# Patient Record
Sex: Male | Born: 1985 | Race: Black or African American | Hispanic: No | Marital: Single | Smoking: Never smoker
Health system: Southern US, Community
[De-identification: ages and names within clinical notes are randomized; demographics above are authoritative.]

## PROBLEM LIST (undated history)

## (undated) DIAGNOSIS — Z21 Asymptomatic human immunodeficiency virus [HIV] infection status: Secondary | ICD-10-CM

## (undated) DIAGNOSIS — B20 Human immunodeficiency virus [HIV] disease: Secondary | ICD-10-CM

## (undated) DIAGNOSIS — J45909 Unspecified asthma, uncomplicated: Secondary | ICD-10-CM

---
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
## 2011-09-27 DIAGNOSIS — B20 Human immunodeficiency virus [HIV] disease: Secondary | ICD-10-CM | POA: Insufficient documentation

## 2013-04-04 DIAGNOSIS — J329 Chronic sinusitis, unspecified: Secondary | ICD-10-CM | POA: Insufficient documentation

## 2013-05-16 DIAGNOSIS — R209 Unspecified disturbances of skin sensation: Secondary | ICD-10-CM | POA: Insufficient documentation

## 2013-09-25 DIAGNOSIS — B353 Tinea pedis: Secondary | ICD-10-CM | POA: Insufficient documentation

## 2013-09-25 DIAGNOSIS — J309 Allergic rhinitis, unspecified: Secondary | ICD-10-CM | POA: Insufficient documentation

## 2013-09-25 DIAGNOSIS — F410 Panic disorder [episodic paroxysmal anxiety] without agoraphobia: Secondary | ICD-10-CM | POA: Insufficient documentation

## 2013-09-25 DIAGNOSIS — G4700 Insomnia, unspecified: Secondary | ICD-10-CM | POA: Insufficient documentation

## 2013-09-25 DIAGNOSIS — L509 Urticaria, unspecified: Secondary | ICD-10-CM | POA: Insufficient documentation

## 2013-09-25 DIAGNOSIS — R634 Abnormal weight loss: Secondary | ICD-10-CM | POA: Insufficient documentation

## 2013-09-25 DIAGNOSIS — J301 Allergic rhinitis due to pollen: Secondary | ICD-10-CM | POA: Insufficient documentation

## 2013-12-26 DIAGNOSIS — F32A Depression, unspecified: Secondary | ICD-10-CM | POA: Insufficient documentation

## 2013-12-26 DIAGNOSIS — F329 Major depressive disorder, single episode, unspecified: Secondary | ICD-10-CM | POA: Insufficient documentation

## 2016-05-12 DIAGNOSIS — J4541 Moderate persistent asthma with (acute) exacerbation: Secondary | ICD-10-CM | POA: Insufficient documentation

## 2016-10-20 DIAGNOSIS — K900 Celiac disease: Secondary | ICD-10-CM | POA: Insufficient documentation

## 2018-01-12 ENCOUNTER — Emergency Department (HOSPITAL_COMMUNITY)
Admission: EM | Admit: 2018-01-12 | Discharge: 2018-01-12 | Disposition: A | Discharge status: Home / Self Care | Payer: BLUE CROSS/BLUE SHIELD | Attending: Emergency Medicine | Admitting: Emergency Medicine

## 2018-01-12 ENCOUNTER — Encounter (HOSPITAL_COMMUNITY): Payer: Self-pay | Admitting: Emergency Medicine

## 2018-01-12 DIAGNOSIS — T7840XA Allergy, unspecified, initial encounter: Secondary | ICD-10-CM | POA: Insufficient documentation

## 2018-01-12 DIAGNOSIS — T783XXA Angioneurotic edema, initial encounter: Secondary | ICD-10-CM | POA: Diagnosis not present

## 2018-01-12 DIAGNOSIS — R220 Localized swelling, mass and lump, head: Secondary | ICD-10-CM | POA: Diagnosis present

## 2018-01-12 MED ORDER — DIPHENHYDRAMINE HCL 25 MG PO TABS: ORAL_TABLET | ORAL | 0 refills | Status: AC

## 2018-01-12 MED ORDER — FAMOTIDINE 20 MG PO TABS: ORAL_TABLET | ORAL | 0 refills | Status: DC

## 2018-01-12 MED ORDER — PREDNISONE 20 MG PO TABS: ORAL_TABLET | ORAL | 0 refills | Status: DC

## 2018-01-12 MED ORDER — FAMOTIDINE 20 MG PO TABS
20.0000 mg | ORAL_TABLET | Freq: Once | ORAL | Status: AC
  Administered 2018-01-12: 20 mg via ORAL
  Filled 2018-01-12: qty 1

## 2018-01-12 MED ORDER — DIPHENHYDRAMINE HCL 25 MG PO CAPS
25.0000 mg | ORAL_CAPSULE | Freq: Once | ORAL | Status: AC
  Administered 2018-01-12: 25 mg via ORAL
  Filled 2018-01-12: qty 1

## 2018-01-12 MED ORDER — PREDNISONE 20 MG PO TABS
60.0000 mg | ORAL_TABLET | Freq: Once | ORAL | Status: AC
  Administered 2018-01-12: 60 mg via ORAL
  Filled 2018-01-12: qty 3

## 2018-01-12 NOTE — Discharge Instructions (Signed)
1.  At this time, your reaction does not appear to severe.  Discussed safety of ongoing annual use of influenza immunization with your infectious disease doctor. 2.  Take prednisone for the next 3 days.  Take Pepcid twice daily for 3 to 4 days.  Take Benadryl 25 to 50 mg every 6 hours as needed for itching and swelling.

## 2018-01-12 NOTE — ED Triage Notes (Signed)
Pt reports last night got flu shot. This morning when woke up had hives and facial swelling. Took Benadryl 25mg  at 730am.

## 2018-01-12 NOTE — ED Provider Notes (Signed)
[facility]-EMERGENCY DEPT Provider Note   CSN: [id number] Arrival date & time: 01/12/18  4098     History   Chief Complaint Chief Complaint  Patient presents with  . Angioedema    HPI Gordon Turner is a 32 y.o. male.  HPI Patient got an influenza shot yesterday.  He was feeling well.  This morning he awakened and noted that his lips were swelling.  He also felt itchy on his legs and arms.  He felt that he was having allergic reaction.  He took 25 mg of Benadryl.  He reports his symptoms have improved since onset.  He denies any difficulty swallowing or breathing.  Reports he did have an allergic reaction about 4 years ago to penicillin that was about "4 times as bad".  Patient does not take an ACE inhibitor.  His only regular scheduled medications are for asthma and his HIV medication.  He reports his HIV is very well controlled and he is compliant with medications.   History reviewed. No pertinent past medical history.  There are no active problems to display for this patient.   History reviewed. No pertinent surgical history.      Home Medications    Prior to Admission medications   Medication Sig Start Date End Date Taking? Authorizing Provider  diphenhydrAMINE (BENADRYL) 25 MG tablet 1 to 2 tablets every 6 hours for itching and swelling 01/12/18   Arby Barrette, MD  famotidine (PEPCID) 20 MG tablet 1 tablet twice daily for 3 to 4 days. 01/12/18   Arby Barrette, MD  predniSONE (DELTASONE) 20 MG tablet 2 tabs po daily x 3 days 01/12/18   Arby Barrette, MD    Family History No family history on file.  Social History Social History   Tobacco Use  . Smoking status: Never Smoker  . Smokeless tobacco: Never Used  Substance Use Topics  . Alcohol use: Yes  . Drug use: Yes    Types: Marijuana     Allergies   Amoxicillin and Penicillins   Review of Systems Review of Systems 10 Systems reviewed and are negative for acute change except as  noted in the HPI.  Physical Exam Updated Vital Signs BP 131/80 (BP Location: Left Arm)   Pulse 86   Temp 98.3 F (36.8 C) (Oral)   Resp 17   Ht 5\' 8"  (1.727 m)   Wt 77.1 kg   SpO2 99%   BMI 25.85 kg/m   Physical Exam  Constitutional: He is oriented to person, place, and time. He appears well-developed and well-nourished. No distress.  HENT:  Patent.  Mild to moderate swelling isolated to the upper lip.  No tongue swelling.  Posterior oropharynx is widely patent.  Neck is supple.  No stridor.  Eyes: EOM are normal.  Very mild local injection of the sclera left eye.  (Patient had not noticed this).  No periorbital swelling.  Neck: Neck supple.  Cardiovascular: Normal rate, regular rhythm, normal heart sounds and intact distal pulses.  Pulmonary/Chest: Effort normal and breath sounds normal.  Abdominal: Soft. He exhibits no distension.  Musculoskeletal: Normal range of motion. He exhibits no edema or tenderness.  Neurological: He is alert and oriented to person, place, and time. He exhibits normal muscle tone. Coordination normal.  Skin: Skin is warm and dry. No rash noted.  No generalized body rash.  Psychiatric: He has a normal mood and affect.     ED Treatments / Results  Labs (all labs ordered are listed,  but only abnormal results are displayed) Labs Reviewed - No data to display  EKG None  Radiology No results found.  Procedures Procedures (including critical care time)  Medications Ordered in ED Medications  predniSONE (DELTASONE) tablet 60 mg (60 mg Oral Given 01/12/18 0904)  famotidine (PEPCID) tablet 20 mg (20 mg Oral Given 01/12/18 0904)  diphenhydrAMINE (BENADRYL) capsule 25 mg (25 mg Oral Given 01/12/18 0904)     Initial Impression / Assessment and Plan / ED Course  I have reviewed the triage vital signs and the nursing notes.  Pertinent labs & imaging results that were available during my care of the patient were reviewed by me and considered in my  medical decision making (see chart for details).    Has a limited amount of facial swelling.  He presumes this is due to influenza shot which she had yesterday.  He is otherwise been on stable medication regimen that does not include ACE inhibitors.  Patient took 25 mg of Benadryl at home and reports his symptoms are improving.  He has no signs of airway impingement.  Stable for discharge with return precautions reviewed.  Final Clinical Impressions(s) / ED Diagnoses   Final diagnoses:  Angioedema of lips, initial encounter  Allergic reaction, initial encounter    ED Discharge Orders         Ordered    predniSONE (DELTASONE) 20 MG tablet     01/12/18 0900    diphenhydrAMINE (BENADRYL) 25 MG tablet     01/12/18 0900    famotidine (PEPCID) 20 MG tablet     01/12/18 0900           Arby Barrette, MD 01/12/18 [id number]

## 2018-01-22 ENCOUNTER — Emergency Department (HOSPITAL_COMMUNITY): Payer: BLUE CROSS/BLUE SHIELD

## 2018-01-22 ENCOUNTER — Other Ambulatory Visit: Payer: Self-pay

## 2018-01-22 ENCOUNTER — Encounter (HOSPITAL_COMMUNITY): Payer: Self-pay | Admitting: *Deleted

## 2018-01-22 ENCOUNTER — Emergency Department (HOSPITAL_COMMUNITY)
Admission: EM | Admit: 2018-01-22 | Discharge: 2018-01-22 | Disposition: A | Discharge status: Home / Self Care | Payer: BLUE CROSS/BLUE SHIELD | Attending: Emergency Medicine | Admitting: Emergency Medicine

## 2018-01-22 DIAGNOSIS — Z5321 Procedure and treatment not carried out due to patient leaving prior to being seen by health care provider: Secondary | ICD-10-CM | POA: Insufficient documentation

## 2018-01-22 DIAGNOSIS — R079 Chest pain, unspecified: Secondary | ICD-10-CM | POA: Diagnosis not present

## 2018-01-22 HISTORY — DX: Human immunodeficiency virus (HIV) disease: B20

## 2018-01-22 HISTORY — DX: Unspecified asthma, uncomplicated: J45.909

## 2018-01-22 HISTORY — DX: Asymptomatic human immunodeficiency virus (hiv) infection status: Z21

## 2018-01-22 LAB — CBC
HEMATOCRIT: 47.9 % (ref 39.0–52.0)
HEMOGLOBIN: 15.4 g/dL (ref 13.0–17.0)
MCH: 28.4 pg (ref 26.0–34.0)
MCHC: 32.2 g/dL (ref 30.0–36.0)
MCV: 88.2 fL (ref 80.0–100.0)
Platelets: 302 10*3/uL (ref 150–400)
RBC: 5.43 MIL/uL (ref 4.22–5.81)
RDW: 12.8 % (ref 11.5–15.5)
WBC: 4.8 10*3/uL (ref 4.0–10.5)
nRBC: 0 % (ref 0.0–0.2)

## 2018-01-22 LAB — BASIC METABOLIC PANEL
Anion gap: 9 (ref 5–15)
BUN: 14 mg/dL (ref 6–20)
CHLORIDE: 99 mmol/L (ref 98–111)
CO2: 27 mmol/L (ref 22–32)
Calcium: 9.1 mg/dL (ref 8.9–10.3)
Creatinine, Ser: 0.86 mg/dL (ref 0.61–1.24)
GFR calc Af Amer: >60 mL/min (ref >60)
Glucose, Bld: 113 mg/dL — ABNORMAL HIGH (ref 70–99)
POTASSIUM: 3.5 mmol/L (ref 3.5–5.1)
SODIUM: 135 mmol/L (ref 135–145)

## 2018-01-22 LAB — POCT I-STAT TROPONIN I: TROPONIN I, POC: 0 ng/mL (ref 0.00–0.08)

## 2018-01-22 NOTE — ED Notes (Signed)
After reassessing vitals, pt said he had to go pick up his son, and he would go see his PCP.

## 2018-01-22 NOTE — ED Triage Notes (Signed)
Pt reports onset of pain in the middle of his chest, nausea, and sob about 30 minutes ago. Reports not feeling well all day. No meds PTA.

## 2018-03-31 ENCOUNTER — Other Ambulatory Visit: Payer: Self-pay

## 2018-03-31 ENCOUNTER — Emergency Department (HOSPITAL_COMMUNITY): Payer: BLUE CROSS/BLUE SHIELD

## 2018-03-31 ENCOUNTER — Encounter (HOSPITAL_COMMUNITY): Payer: Self-pay | Admitting: Emergency Medicine

## 2018-03-31 ENCOUNTER — Inpatient Hospital Stay (HOSPITAL_COMMUNITY)
Admission: EM | Admit: 2018-03-31 | Discharge: 2018-04-02 | DRG: 389 | Disposition: A | Discharge status: Home / Self Care | Payer: BLUE CROSS/BLUE SHIELD | Attending: Surgery | Admitting: Surgery

## 2018-03-31 ENCOUNTER — Observation Stay (HOSPITAL_COMMUNITY): Payer: BLUE CROSS/BLUE SHIELD

## 2018-03-31 DIAGNOSIS — R1084 Generalized abdominal pain: Secondary | ICD-10-CM | POA: Diagnosis not present

## 2018-03-31 DIAGNOSIS — K56609 Unspecified intestinal obstruction, unspecified as to partial versus complete obstruction: Secondary | ICD-10-CM | POA: Diagnosis present

## 2018-03-31 DIAGNOSIS — Z79899 Other long term (current) drug therapy: Secondary | ICD-10-CM

## 2018-03-31 DIAGNOSIS — R457 State of emotional shock and stress, unspecified: Secondary | ICD-10-CM | POA: Diagnosis not present

## 2018-03-31 DIAGNOSIS — Z9089 Acquired absence of other organs: Secondary | ICD-10-CM

## 2018-03-31 DIAGNOSIS — Z7952 Long term (current) use of systemic steroids: Secondary | ICD-10-CM | POA: Diagnosis not present

## 2018-03-31 DIAGNOSIS — Z881 Allergy status to other antibiotic agents status: Secondary | ICD-10-CM

## 2018-03-31 DIAGNOSIS — Z880 Allergy status to penicillin: Secondary | ICD-10-CM

## 2018-03-31 DIAGNOSIS — B20 Human immunodeficiency virus [HIV] disease: Secondary | ICD-10-CM | POA: Diagnosis present

## 2018-03-31 DIAGNOSIS — J45909 Unspecified asthma, uncomplicated: Secondary | ICD-10-CM | POA: Diagnosis present

## 2018-03-31 DIAGNOSIS — K4090 Unilateral inguinal hernia, without obstruction or gangrene, not specified as recurrent: Secondary | ICD-10-CM | POA: Diagnosis not present

## 2018-03-31 DIAGNOSIS — K4030 Unilateral inguinal hernia, with obstruction, without gangrene, not specified as recurrent: Secondary | ICD-10-CM | POA: Diagnosis not present

## 2018-03-31 DIAGNOSIS — Z0189 Encounter for other specified special examinations: Secondary | ICD-10-CM

## 2018-03-31 DIAGNOSIS — R52 Pain, unspecified: Secondary | ICD-10-CM | POA: Diagnosis not present

## 2018-03-31 DIAGNOSIS — Z4682 Encounter for fitting and adjustment of non-vascular catheter: Secondary | ICD-10-CM | POA: Diagnosis not present

## 2018-03-31 DIAGNOSIS — K5651 Intestinal adhesions [bands], with partial obstruction: Secondary | ICD-10-CM | POA: Diagnosis not present

## 2018-03-31 DIAGNOSIS — R0602 Shortness of breath: Secondary | ICD-10-CM | POA: Diagnosis not present

## 2018-03-31 LAB — COMPREHENSIVE METABOLIC PANEL
ALT: 27 U/L (ref 0–44)
AST: 28 U/L (ref 15–41)
Albumin: 4.4 g/dL (ref 3.5–5.0)
Alkaline Phosphatase: 67 U/L (ref 38–126)
Anion gap: 12 (ref 5–15)
BUN: 13 mg/dL (ref 6–20)
CO2: 22 mmol/L (ref 22–32)
Calcium: 9.5 mg/dL (ref 8.9–10.3)
Chloride: 104 mmol/L (ref 98–111)
Creatinine, Ser: 0.91 mg/dL (ref 0.61–1.24)
GFR calc Af Amer: >60 mL/min (ref >60)
GFR calc non Af Amer: >60 mL/min (ref >60)
Glucose, Bld: 129 mg/dL — ABNORMAL HIGH (ref 70–99)
Potassium: 3.5 mmol/L (ref 3.5–5.1)
Sodium: 138 mmol/L (ref 135–145)
Total Bilirubin: 0.8 mg/dL (ref 0.3–1.2)
Total Protein: 8.3 g/dL — ABNORMAL HIGH (ref 6.5–8.1)

## 2018-03-31 LAB — URINALYSIS, ROUTINE W REFLEX MICROSCOPIC
Bilirubin Urine: NEGATIVE
Glucose, UA: NEGATIVE mg/dL
Hgb urine dipstick: NEGATIVE
Ketones, ur: NEGATIVE mg/dL
Leukocytes, UA: NEGATIVE
Nitrite: NEGATIVE
Protein, ur: NEGATIVE mg/dL
Specific Gravity, Urine: >1.046 — ABNORMAL HIGH (ref 1.005–1.030)
pH: 7 (ref 5.0–8.0)

## 2018-03-31 LAB — CBC
HCT: 48.5 % (ref 39.0–52.0)
Hemoglobin: 16.5 g/dL (ref 13.0–17.0)
MCH: 28.7 pg (ref 26.0–34.0)
MCHC: 34 g/dL (ref 30.0–36.0)
MCV: 84.3 fL (ref 80.0–100.0)
Platelets: 336 10*3/uL (ref 150–400)
RBC: 5.75 MIL/uL (ref 4.22–5.81)
RDW: 12.8 % (ref 11.5–15.5)
WBC: 14.1 10*3/uL — ABNORMAL HIGH (ref 4.0–10.5)
nRBC: 0 % (ref 0.0–0.2)

## 2018-03-31 LAB — LIPASE, BLOOD: Lipase: 27 U/L (ref 11–51)

## 2018-03-31 MED ORDER — ONDANSETRON 4 MG PO TBDP: 4.0000 mg | ORAL_TABLET | Freq: Four times a day (QID) | ORAL | Status: DC | PRN

## 2018-03-31 MED ORDER — IOPAMIDOL (ISOVUE-300) INJECTION 61%
INTRAVENOUS | Status: AC
  Filled 2018-03-31: qty 100

## 2018-03-31 MED ORDER — MOMETASONE FURO-FORMOTEROL FUM 100-5 MCG/ACT IN AERO
2.0000 | INHALATION_SPRAY | Freq: Two times a day (BID) | RESPIRATORY_TRACT | Status: DC
  Administered 2018-03-31 – 2018-04-02 (×4): 2 via RESPIRATORY_TRACT
  Filled 2018-03-31: qty 8.8

## 2018-03-31 MED ORDER — DIPHENHYDRAMINE HCL 50 MG/ML IJ SOLN
25.0000 mg | Freq: Four times a day (QID) | INTRAMUSCULAR | Status: DC | PRN
  Administered 2018-03-31 (×2): 25 mg via INTRAVENOUS
  Filled 2018-03-31 (×2): qty 1

## 2018-03-31 MED ORDER — ENOXAPARIN SODIUM 40 MG/0.4ML ~~LOC~~ SOLN
40.0000 mg | SUBCUTANEOUS | Status: DC
  Administered 2018-03-31 – 2018-04-01 (×2): 40 mg via SUBCUTANEOUS
  Filled 2018-03-31 (×3): qty 0.4

## 2018-03-31 MED ORDER — POTASSIUM CHLORIDE IN NACL 20-0.9 MEQ/L-% IV SOLN
INTRAVENOUS | Status: DC
  Administered 2018-03-31 – 2018-04-02 (×6): via INTRAVENOUS
  Filled 2018-03-31 (×7): qty 1000

## 2018-03-31 MED ORDER — ONDANSETRON 4 MG PO TBDP
4.0000 mg | ORAL_TABLET | Freq: Once | ORAL | Status: AC | PRN
  Administered 2018-03-31: 4 mg via ORAL
  Filled 2018-03-31: qty 1

## 2018-03-31 MED ORDER — DIPHENHYDRAMINE HCL 25 MG PO CAPS: 25.0000 mg | ORAL_CAPSULE | Freq: Four times a day (QID) | ORAL | Status: DC | PRN

## 2018-03-31 MED ORDER — SODIUM CHLORIDE (PF) 0.9 % IJ SOLN
INTRAMUSCULAR | Status: AC
  Filled 2018-03-31: qty 50

## 2018-03-31 MED ORDER — MORPHINE SULFATE (PF) 2 MG/ML IV SOLN
1.0000 mg | INTRAVENOUS | Status: DC | PRN
  Administered 2018-03-31 (×2): 2 mg via INTRAVENOUS
  Administered 2018-03-31: 4 mg via INTRAVENOUS
  Administered 2018-03-31 – 2018-04-02 (×10): 2 mg via INTRAVENOUS
  Filled 2018-03-31: qty 2
  Filled 2018-03-31 (×12): qty 1

## 2018-03-31 MED ORDER — SODIUM CHLORIDE 0.9 % IV BOLUS
500.0000 mL | Freq: Once | INTRAVENOUS | Status: AC
  Administered 2018-03-31: 500 mL via INTRAVENOUS

## 2018-03-31 MED ORDER — ALBUTEROL SULFATE (2.5 MG/3ML) 0.083% IN NEBU: 2.5000 mg | INHALATION_SOLUTION | RESPIRATORY_TRACT | Status: DC | PRN

## 2018-03-31 MED ORDER — IOPAMIDOL (ISOVUE-300) INJECTION 61%
100.0000 mL | Freq: Once | INTRAVENOUS | Status: AC | PRN
  Administered 2018-03-31: 100 mL via INTRAVENOUS

## 2018-03-31 MED ORDER — MORPHINE SULFATE (PF) 4 MG/ML IV SOLN
4.0000 mg | Freq: Once | INTRAVENOUS | Status: AC
  Administered 2018-03-31: 4 mg via INTRAVENOUS
  Filled 2018-03-31: qty 1

## 2018-03-31 MED ORDER — ALBUTEROL SULFATE HFA 108 (90 BASE) MCG/ACT IN AERS
2.0000 | INHALATION_SPRAY | RESPIRATORY_TRACT | Status: DC | PRN
  Filled 2018-03-31: qty 6.7

## 2018-03-31 MED ORDER — ONDANSETRON HCL 4 MG/2ML IJ SOLN
4.0000 mg | Freq: Four times a day (QID) | INTRAMUSCULAR | Status: DC | PRN
  Administered 2018-03-31: 4 mg via INTRAVENOUS
  Filled 2018-03-31: qty 2

## 2018-03-31 NOTE — ED Notes (Signed)
Bed: WLPT4 Expected date:  Expected time:  Means of arrival:  Comments: 

## 2018-03-31 NOTE — ED Provider Notes (Signed)
[facility]-EMERGENCY DEPT Provider Note   CSN: [id number] Arrival date & time: 03/31/18  0340     History   Chief Complaint Chief Complaint  Patient presents with  . Abdominal Pain    HPI Gordon Turner is a 33 y.o. 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 Patient's pain is located in the RLQ. He states it is constant with sharp increases in pain. Current discomfort is a 10/10 in severity without specific alleviating/aggravating factors. Reports associated nausea w/ non bloody emesis. His last BM was yesterday around 1300 and was normal. Has had some chills, but no fevers. Denies chest pain, dyspnea, or dysuria. Last PO intake 2100 last night.   HPI  Past Medical History:  Diagnosis Date  . Asthma   . HIV (human immunodeficiency virus infection) (HCC)     There are no active problems to display for this patient.   History reviewed. No pertinent surgical history.      Home Medications    Prior to Admission medications   Medication Sig Start Date End Date Taking? Authorizing Provider  diphenhydrAMINE (BENADRYL) 25 MG tablet 1 to 2 tablets every 6 hours for itching and swelling 01/12/18   Arby Barrette, MD  famotidine (PEPCID) 20 MG tablet 1 tablet twice daily for 3 to 4 days. 01/12/18   Arby Barrette, MD  predniSONE (DELTASONE) 20 MG tablet 2 tabs po daily x 3 days 01/12/18   Arby Barrette, MD    Family History History reviewed. No pertinent family history.  Social History Social History   Tobacco Use  . Smoking status: Never Smoker  . Smokeless tobacco: Never Used  Substance Use Topics  . Alcohol use: Yes  . Drug use: Yes    Types: Marijuana     Allergies   Amoxicillin; Penicillin g; and Penicillins   Review of Systems Review of Systems  Constitutional: Positive for chills.  Negative for fever.  Respiratory: Negative for shortness of breath.   Cardiovascular: Negative for chest pain.  Gastrointestinal: Positive for abdominal pain, nausea and vomiting. Negative for blood in stool, constipation and diarrhea.  Genitourinary: Negative for dysuria.  All other systems reviewed and are negative.    Physical Exam Updated Vital Signs BP 132/80   Pulse (!) 117   Temp 99.1 F (37.3 C) (Oral)   Resp 16   Ht 5\' 8"  (1.727 m)   Wt 77.1 kg   SpO2 98%   BMI 25.85 kg/m   Physical Exam Vitals signs and nursing note reviewed. Exam conducted with a chaperone present.  Constitutional:      General: He is in acute distress (appears uncomfortable).     Appearance: He is well-developed. He is not toxic-appearing.  HENT:     Head: Normocephalic and atraumatic.  Eyes:     General:        Right eye: No discharge.        Left eye: No discharge.     Conjunctiva/sclera: Conjunctivae normal.  Neck:     Musculoskeletal: Neck supple.  Cardiovascular:     Rate and Rhythm: Regular rhythm. Tachycardia present.  Pulmonary:     Effort: Pulmonary effort is normal. No respiratory distress.     Breath sounds: Normal breath sounds. No wheezing, rhonchi or rales.  Abdominal:     General: There is distension (mild).  Tenderness: There is abdominal tenderness in the right lower quadrant. There is guarding (mild). There is no rebound.  Genitourinary:    Comments: Palpable right inguinal defect.  Skin:    General: Skin is warm and dry.     Findings: No rash.  Neurological:     Mental Status: He is alert.     Comments: Clear speech.   Psychiatric:        Behavior: Behavior normal.    ED Treatments / Results  Labs (all labs ordered are listed, but only abnormal results are displayed) Labs Reviewed  COMPREHENSIVE METABOLIC PANEL - Abnormal; Notable for the following components:      Result Value   Glucose, Bld 129 (*)    Total Protein 8.3 (*)    All other components  within normal limits  CBC - Abnormal; Notable for the following components:   WBC 14.1 (*)    All other components within normal limits  LIPASE, BLOOD  URINALYSIS, ROUTINE W REFLEX MICROSCOPIC    EKG None  Radiology Ct Abdomen Pelvis W Contrast  Result Date: 03/31/2018 CLINICAL DATA:  Right lower quadrant abdominal pain. Symptoms began yesterday. Previous appendectomy. EXAM: CT ABDOMEN AND PELVIS WITH CONTRAST TECHNIQUE: Multidetector CT imaging of the abdomen and pelvis was performed using the standard protocol following bolus administration of intravenous contrast. CONTRAST:  ISOVUE-300 IOPAMIDOL (ISOVUE-300) INJECTION 61% COMPARISON:  None. FINDINGS: Lower chest: Normal Hepatobiliary: Mild fatty change of the liver. No focal lesion. No calcified gallstones. Pancreas: Normal Spleen: Normal Adrenals/Urinary Tract: Adrenal glands are normal. Kidneys are normal. Bladder is normal. Stomach/Bowel: There is acute small bowel obstruction. 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 There is a fluid-filled structure in an inguinal hernia on the right that I assume is small intestine. Is difficult to follow this into the hernia of but I think this is intestine. I am not 100% certain of that however. Distal small intestine appears collapsed. Vascular/Lymphatic: Normal Reproductive: Normal Other: Tiny amount of free fluid, often seen in acute small bowel obstruction. No free air. Musculoskeletal: Mild spinal curvature. IMPRESSION: Acute small bowel obstruction, favored secondary to an incarcerated right inguinal hernia. It is difficult to follow the small intestine into the hernia, but I think that is what is going on here. Electronically Signed   By: Paulina Fusi M.D.   On: 03/31/2018 07:37    Procedures Procedures (including critical care time) CRITICAL CARE Performed by: Harvie Heck   Total critical care time: 30 minutes  Critical care time was exclusive of  separately billable procedures and treating other patients.  Critical care was necessary to treat or prevent imminent or life-threatening deterioration.  Critical care was time spent personally by me on the following activities: development of treatment plan with patient and/or surrogate as well as nursing, discussions with consultants, evaluation of patient's response to treatment, examination of patient, obtaining history from patient or surrogate, ordering and performing treatments and interventions, ordering and review of laboratory studies, ordering and review of radiographic studies, pulse oximetry and re-evaluation of patient's condition.  Medications Ordered in ED Medications  sodium chloride 0.9 % bolus 500 mL (500 mLs Intravenous New Bag/Given 03/31/18 0737)  iopamidol (ISOVUE-300) 61 % injection (  Hold 03/31/18 0734)  sodium chloride (PF) 0.9 % injection (0 mLs  Hold 03/31/18 0734)  ondansetron (ZOFRAN-ODT) disintegrating tablet 4 mg (4 mg Oral Given 03/31/18 0655)  morphine 4 MG/ML injection 4 mg (4 mg Intravenous Given 03/31/18 0655)  iopamidol (  ISOVUE-300) 61 % injection 100 mL (100 mLs Intravenous Contrast Given 03/31/18 0723)    Initial Impression / Assessment and Plan / ED Course  I have reviewed the triage vital signs and the nursing notes.  Pertinent labs & imaging results that were available during my care of the patient were reviewed by me and considered in my medical decision making (see chart for details).   Patient presents with RLQ abdominal pain. Nontoxic appearing, however does appear uncomfortable. Vitals with mild tachycardia. Exam with RLQ tenderness, mild guarding initially, mildly distended.  Labs per triage reviewed: Leukocytosis at 14.1. Hyperglycemia at 129. No electrolyte derangement. LFTs, lipase, and creatinine WNL. UA pending. Will proceed with CT abdomen/pelvis. Morphine & fuids ordered. Has received zofran by triage team.   CT abdomen/pelvis with acute small  bowel obstruction, favored secondary to an incarcerated right inguinal hernia, however somewhat difficult interpretation.   7:45: RE-EVAL: Patient resting more comfortably with pain improvement. No guarding on repeat abdominal exam. GU re-examined with supervising physician Dr. Juleen ChinaKohut- appreciable inguinal defect, but somewhat difficult assessment for correlation with CT imaging. Will consult general surgery.   08:03: CONSULT: Discussed with general surgeon Dr. Magnus IvanBlackman who will evaluate & admit the patient.   Final Clinical Impressions(s) / ED Diagnoses   Final diagnoses:  Small bowel obstruction [facility](HCC)  Obstructed inguinal hernia    ED Discharge Orders    None       Cherly Andersonetrucelli, Chiara Coltrin R, PA-C 03/31/18 [id number]    Raeford RazorKohut, Stephen, MD 04/01/18 [phone]

## 2018-03-31 NOTE — ED Notes (Signed)
ED TO INPATIENT HANDOFF REPORT  Name/Age/Gender Gordon Turner 33 y.o. male  Code Status    Code Status Orders  (From admission, onward)         Start     Ordered   03/31/18 1011  Full code  Continuous     03/31/18 1010        Code Status History    This patient has a current code status but no historical code status.      Home/SNF/Other Home  Chief Complaint Abd Pain  Level of Care/Admitting Diagnosis ED Disposition    ED Disposition Condition Comment   Admit  Hospital Area: [facility] [[id number]]  Level of Care: Med-Surg [16]  Diagnosis: SBO (small bowel obstruction) [facility]) [[id number]]  Admitting Physician: Abigail Miyamoto [[id number]]  Attending Physician: CCS, MD [[id number]]  Bed request comments: 5W if possible  PT Class (Do Not Modify): Observation [104]  PT Acc Code (Do Not Modify): Observation [10022]       Medical History Past Medical History:  Diagnosis Date  . Asthma   . HIV (human immunodeficiency virus infection) (HCC)     Allergies Allergies  Allergen Reactions  . Amoxicillin Anaphylaxis  . Penicillins Anaphylaxis, Hives, Swelling and Other (See Comments)    DID THE REACTION INVOLVE: Swelling of the face/tongue/throat, SOB, or low BP? No Sudden or severe rash/hives, skin peeling, or the inside of the mouth or nose? No Did it require medical treatment? No When did it last happen?baby If all above answers are "NO", may proceed with cephalosporin use.    IV Location/Drains/Wounds Patient Lines/Drains/Airways Status   Active Line/Drains/Airways    Name:   Placement date:   Placement time:   Site:   Days:   Peripheral IV 03/31/18 Left Antecubital   03/31/18    0653    Antecubital   less than 1   NG/OG Tube Nasogastric 14 Fr. Right nare Aucultation   03/31/18    0941    Right nare   less than 1          Labs/Imaging Results for orders placed or performed during the hospital encounter of 03/31/18 (from the past 48 hour(s))   Lipase, blood     Status: None   Collection Time: 03/31/18  4:35 AM  Result Value Ref Range   Lipase 27 11 - 51 U/L    Comment: Performed at [facility], [address], [place], [place] [address]  Comprehensive metabolic panel     Status: Abnormal   Collection Time: 03/31/18  4:35 AM  Result Value Ref Range   Sodium 138 135 - 145 mmol/L   Potassium 3.5 3.5 - 5.1 mmol/L   Chloride 104 98 - 111 mmol/L   CO2 22 22 - 32 mmol/L   Glucose, Bld 129 (H) 70 - 99 mg/dL   BUN 13 6 - 20 mg/dL   Creatinine, Ser 9.81 0.61 - 1.24 mg/dL   Calcium 9.5 8.9 - 19.1 mg/dL   Total Protein 8.3 (H) 6.5 - 8.1 g/dL   Albumin 4.4 3.5 - 5.0 g/dL   AST 28 15 - 41 U/L   ALT 27 0 - 44 U/L   Alkaline Phosphatase 67 38 - 126 U/L   Total Bilirubin 0.8 0.3 - 1.2 mg/dL   GFR calc non Af Amer >60 >60 mL/min   GFR calc Af Amer >60 >60 mL/min   Anion gap 12 5 - 15    Comment: Performed at [facility], [address] [place], [place] [address]  CBC     Status: Abnormal   Collection Time: 03/31/18  4:35 AM  Result Value Ref Range   WBC 14.1 (H) 4.0 - 10.5 K/uL   RBC 5.75 4.22 - 5.81 MIL/uL   Hemoglobin 16.5 13.0 - 17.0 g/dL   HCT 60.448.5 54.039.0 - 98.152.0 %   MCV 84.3 80.0 - 100.0 fL   MCH 28.7 26.0 - 34.0 pg   MCHC 34.0 30.0 - 36.0 g/dL   RDW 19.112.8 47.811.5 - 29.515.5 %   Platelets 336 150 - 400 K/uL   nRBC 0.0 0.0 - 0.2 %    Comment: Performed at [facility], [address] [place], [place] [address]  Urinalysis, Routine w reflex microscopic     Status: Abnormal   Collection Time: 03/31/18  9:06 AM  Result Value Ref Range   Color, Urine YELLOW YELLOW   APPearance CLEAR CLEAR   Specific Gravity, Urine >1.046 (H) 1.005 - 1.030   pH 7.0 5.0 - 8.0   Glucose, UA NEGATIVE NEGATIVE mg/dL   Hgb urine dipstick NEGATIVE NEGATIVE   Bilirubin Urine NEGATIVE NEGATIVE   Ketones, ur NEGATIVE NEGATIVE mg/dL   Protein, ur NEGATIVE NEGATIVE mg/dL   Nitrite NEGATIVE NEGATIVE    Leukocytes, UA NEGATIVE NEGATIVE    Comment: Performed at [facility], [address] [place], [place] [address]   Dg Abdomen 1 View  Result Date: 03/31/2018 CLINICAL DATA:  NG tube placement EXAM: ABDOMEN - 1 VIEW COMPARISON:  03/31/2018 FINDINGS: NG tube is in place. The tip projects over the duodenal bulb. Mildly distended proximal small bowel loops are noted. No obvious free intraperitoneal gas. IMPRESSION: NG tube tip projects over the duodenal bulb. Electronically Signed   By: Jolaine ClickArthur  Hoss M.D.   On: 03/31/2018 10:22   Ct Abdomen Pelvis W Contrast  Result Date: 03/31/2018 CLINICAL DATA:  Right lower quadrant abdominal pain. Symptoms began yesterday. Previous appendectomy. EXAM: CT ABDOMEN AND PELVIS WITH CONTRAST TECHNIQUE: Multidetector CT imaging of the abdomen and pelvis was performed using the standard protocol following bolus administration of intravenous contrast. CONTRAST:  100mL ISOVUE-300 IOPAMIDOL (ISOVUE-300) INJECTION 61% COMPARISON:  None. FINDINGS: Lower chest: Normal Hepatobiliary: Mild fatty change of the liver. No focal lesion. No calcified gallstones. Pancreas: Normal Spleen: Normal Adrenals/Urinary Tract: Adrenal glands are normal. Kidneys are normal. Bladder is normal. Stomach/Bowel: There is acute small bowel obstruction. 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 There is a fluid-filled structure in an inguinal hernia on the right that I assume is small intestine. Is difficult to follow this into the hernia of but I think this is intestine. I am not 100% certain of that however. Distal small intestine appears collapsed. Vascular/Lymphatic: Normal Reproductive: Normal Other: Tiny amount of free fluid, often seen in acute small bowel obstruction. No free air. Musculoskeletal: Mild spinal curvature. IMPRESSION: Acute small bowel obstruction, favored secondary to an incarcerated right inguinal hernia. It is difficult to follow the small  intestine into the hernia, but I think that is what is going on here. Electronically Signed   By: Paulina FusiMark  Shogry M.D.   On: 03/31/2018 07:37    Pending Labs [employer] Labs (From admission, onward)    Start     Ordered   Signed and Armed forces training and education officerHeld  Basic metabolic panel  Tomorrow morning,   R     Signed and Held   Signed and Held  CBC  Tomorrow morning,  R     Signed and Held          Vitals/Pain Today's Vitals   03/31/18 1020 03/31/18 1030 03/31/18 1100 03/31/18 1113  BP:  129/82 121/79   Pulse:  86 83   Resp:   16   Temp:      TempSrc:      SpO2:  98% 96%   Weight:      Height:      PainSc: 8    Asleep    Isolation Precautions No active isolations  Medications Medications  iopamidol (ISOVUE-300) 61 % injection (  Hold 03/31/18 0734)  sodium chloride (PF) 0.9 % injection (0 mLs  Hold 03/31/18 0734)  albuterol (PROVENTIL HFA;VENTOLIN HFA) 108 (90 Base) MCG/ACT inhaler 2 puff (has no administration in time range)  0.9 % NaCl with KCl 20 mEq/ L  infusion (has no administration in time range)  morphine 2 MG/ML injection 1-4 mg (4 mg Intravenous Given 03/31/18 1024)  ondansetron (ZOFRAN-ODT) disintegrating tablet 4 mg ( Oral See Alternative 03/31/18 1024)    Or  ondansetron (ZOFRAN) injection 4 mg (4 mg Intravenous Given 03/31/18 1024)  ondansetron (ZOFRAN-ODT) disintegrating tablet 4 mg (4 mg Oral Given 03/31/18 0655)  morphine 4 MG/ML injection 4 mg (4 mg Intravenous Given 03/31/18 0655)  sodium chloride 0.9 % bolus 500 mL (0 mLs Intravenous Stopped 03/31/18 0910)  iopamidol (ISOVUE-300) 61 % injection 100 mL (100 mLs Intravenous Contrast Given 03/31/18 0723)    Mobility walks

## 2018-03-31 NOTE — H&P (Signed)
Gordon DessertDavid Turner is an 33 y.o. male.   Chief Complaint: abdominal pain HPI: This is a 33 year old gentleman who presents with the sudden onset of right-sided abdominal pain.  He was at work and had a normal bowel movement and then several hours later started having nausea, vomiting, and abdominal pain.  He presented to the emergency department.  A CT scan of the abdomen and pelvis was performed showing findings consistent with a small bowel obstruction.  There was a right inguinal hernia but radiology was uncertain whether this contained bowel or just fluid.  He has a prior history of a bowel resection as a premature newborn.  He has no previous history of bowel obstructions.  Past Medical History:  Diagnosis Date  . Asthma   . HIV (human immunodeficiency virus infection) (HCC)     History reviewed. No pertinent surgical history.  History reviewed. No pertinent family history. Social History:  reports that he has never smoked. He has never used smokeless tobacco. He reports current alcohol use. He reports current drug use. Drug: Marijuana.  Allergies:  Allergies  Allergen Reactions  . Amoxicillin Anaphylaxis  . Penicillins Anaphylaxis, Hives, Swelling and Other (See Comments)    DID THE REACTION INVOLVE: Swelling of the face/tongue/throat, SOB, or low BP? No Sudden or severe rash/hives, skin peeling, or the inside of the mouth or nose? No Did it require medical treatment? No When did it last happen?baby If all above answers are "NO", may proceed with cephalosporin use.    (Not in a hospital admission)   Results for orders placed or performed during the hospital encounter of 03/31/18 (from the past 48 hour(s))  Lipase, blood     Status: None   Collection Time: 03/31/18  4:35 AM  Result Value Ref Range   Lipase 27 11 - 51 U/L    Comment: Performed at [facility], [address] [place], [place] [address]  Comprehensive metabolic panel     Status: Abnormal    Collection Time: 03/31/18  4:35 AM  Result Value Ref Range   Sodium 138 135 - 145 mmol/L   Potassium 3.5 3.5 - 5.1 mmol/L   Chloride 104 98 - 111 mmol/L   CO2 22 22 - 32 mmol/L   Glucose, Bld 129 (H) 70 - 99 mg/dL   BUN 13 6 - 20 mg/dL   Creatinine, Ser 6.040.91 0.61 - 1.24 mg/dL   Calcium 9.5 8.9 - 54.010.3 mg/dL   Total Protein 8.3 (H) 6.5 - 8.1 g/dL   Albumin 4.4 3.5 - 5.0 g/dL   AST 28 15 - 41 U/L   ALT 27 0 - 44 U/L   Alkaline Phosphatase 67 38 - 126 U/L   Total Bilirubin 0.8 0.3 - 1.2 mg/dL   GFR calc non Af Amer >60 >60 mL/min   GFR calc Af Amer >60 >60 mL/min   Anion gap 12 5 - 15    Comment: Performed at [facility], [address] [place], [place] [address]  CBC     Status: Abnormal   Collection Time: 03/31/18  4:35 AM  Result Value Ref Range   WBC 14.1 (H) 4.0 - 10.5 K/uL   RBC 5.75 4.22 - 5.81 MIL/uL   Hemoglobin 16.5 13.0 - 17.0 g/dL   HCT 14.748.5 82.939.0 - 56.252.0 %   MCV 84.3 80.0 - 100.0 fL   MCH 28.7 26.0 - 34.0 pg   MCHC 34.0 30.0 - 36.0 g/dL   RDW 13.012.8 86.511.5 -  15.5 %   Platelets 336 150 - 400 K/uL   nRBC 0.0 0.0 - 0.2 %    Comment: Performed at [facility], [address], [place], [place] [address]   Ct Abdomen Pelvis W Contrast  Result Date: 03/31/2018 CLINICAL DATA:  Right lower quadrant abdominal pain. Symptoms began yesterday. Previous appendectomy. EXAM: CT ABDOMEN AND PELVIS WITH CONTRAST TECHNIQUE: Multidetector CT imaging of the abdomen and pelvis was performed using the standard protocol following bolus administration of intravenous contrast. CONTRAST:  ISOVUE-300 IOPAMIDOL (ISOVUE-300) INJECTION 61% COMPARISON:  None. FINDINGS: Lower chest: Normal Hepatobiliary: Mild fatty change of the liver. No focal lesion. No calcified gallstones. Pancreas: Normal Spleen: Normal Adrenals/Urinary Tract: Adrenal glands are normal. Kidneys are normal. Bladder is normal. Stomach/Bowel: There is acute small bowel obstruction. 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 There is a fluid-filled structure in an inguinal hernia on the right that I assume is small intestine. Is difficult to follow this into the hernia of but I think this is intestine. I am not 100% certain of that however. Distal small intestine appears collapsed. Vascular/Lymphatic: Normal Reproductive: Normal Other: Tiny amount of free fluid, often seen in acute small bowel obstruction. No free air. Musculoskeletal: Mild spinal curvature. IMPRESSION: Acute small bowel obstruction, favored secondary to an incarcerated right inguinal hernia. It is difficult to follow the small intestine into the hernia, but I think that is what is going on here. Electronically Signed   By: Paulina Fusi M.D.   On: 03/31/2018 07:37    Review of Systems  Constitutional: Negative for chills and fever.  Respiratory: Negative for cough and shortness of breath.   Cardiovascular: Negative for chest pain.  Gastrointestinal: Positive for abdominal pain, nausea and vomiting.  Genitourinary: Negative for dysuria.  All other systems reviewed and are negative.   Blood pressure 123/76, pulse 96, temperature 99.1 F (37.3 C), temperature source Oral, resp. rate 18, height 5\' 8"  (1.727 m), weight 77.1 kg, SpO2 95 %. Physical Exam  Constitutional: He is oriented to person, place, and time. He appears well-developed and well-nourished. No distress.  HENT:  Head: Normocephalic and atraumatic.  Right Ear: External ear normal.  Left Ear: External ear normal.  Nose: Nose normal.  Mouth/Throat: Oropharynx is clear and moist. No oropharyngeal exudate.  Eyes: Pupils are equal, round, and reactive to light. Right eye exhibits no discharge. Left eye exhibits no discharge. No scleral icterus.  Neck: Normal range of motion. Neck supple. No tracheal deviation present.  Cardiovascular: Normal rate, regular rhythm, normal heart sounds and intact distal pulses.  No murmur  heard. Respiratory: Effort normal and breath sounds normal. No respiratory distress. He has no wheezes.  GI: He exhibits distension. There is abdominal tenderness.  There is a right inguinal hernia which is completely reducible and nontender and I suspect contains only fluid.  There is a well-healed incision transversely on the right upper abdomen.  There is distention with mild diffuse tenderness  Musculoskeletal: Normal range of motion.        General: No tenderness, deformity or edema.  Neurological: He is alert and oriented to person, place, and time.  Skin: Skin is warm and dry. He is not diaphoretic. No erythema.  Psychiatric: His behavior is normal. Judgment normal.     Assessment/Plan Small bowel obstruction  Given the ease of reducing the inguinal hernia and the radiographic findings, I do not suspect the hernia is the cause of the obstruction.  I suspect it is probably adhesions from his prior surgery.  I discussed this with the patient and his mother.  He does not have an acute abdomen so we will proceed with conservative management.  I am recommending nasogastric tube placement and bowel rest.  If he is not improved by the morning, we will start the small bowel protocol.  They understand and agree with the admission and plans.  Gordon Miyamoto, MD 03/31/2018, 8:28 AM

## 2018-03-31 NOTE — ED Notes (Signed)
Bed: WA03 Expected date:  Expected time:  Means of arrival:  Comments: 

## 2018-03-31 NOTE — ED Notes (Signed)
XR at bedside

## 2018-03-31 NOTE — ED Notes (Signed)
Patient transported to CT 

## 2018-03-31 NOTE — ED Triage Notes (Addendum)
Patient brought in by [facility]. Patient is complaining of lower right abdomen. Patient states pain 10/10. Patient states it started around 3pm yesterday. Patient states it is progressively getting worse. Patient had bowel reconstruction done as a child.

## 2018-04-01 ENCOUNTER — Inpatient Hospital Stay (HOSPITAL_COMMUNITY): Payer: BLUE CROSS/BLUE SHIELD

## 2018-04-01 ENCOUNTER — Observation Stay (HOSPITAL_COMMUNITY): Payer: BLUE CROSS/BLUE SHIELD

## 2018-04-01 DIAGNOSIS — K4090 Unilateral inguinal hernia, without obstruction or gangrene, not specified as recurrent: Secondary | ICD-10-CM | POA: Diagnosis present

## 2018-04-01 DIAGNOSIS — Z881 Allergy status to other antibiotic agents status: Secondary | ICD-10-CM | POA: Diagnosis not present

## 2018-04-01 DIAGNOSIS — Z7952 Long term (current) use of systemic steroids: Secondary | ICD-10-CM | POA: Diagnosis not present

## 2018-04-01 DIAGNOSIS — B20 Human immunodeficiency virus [HIV] disease: Secondary | ICD-10-CM | POA: Diagnosis present

## 2018-04-01 DIAGNOSIS — Z79899 Other long term (current) drug therapy: Secondary | ICD-10-CM | POA: Diagnosis not present

## 2018-04-01 DIAGNOSIS — Z9089 Acquired absence of other organs: Secondary | ICD-10-CM | POA: Diagnosis not present

## 2018-04-01 DIAGNOSIS — K56609 Unspecified intestinal obstruction, unspecified as to partial versus complete obstruction: Secondary | ICD-10-CM | POA: Diagnosis present

## 2018-04-01 DIAGNOSIS — Z880 Allergy status to penicillin: Secondary | ICD-10-CM | POA: Diagnosis not present

## 2018-04-01 DIAGNOSIS — J45909 Unspecified asthma, uncomplicated: Secondary | ICD-10-CM | POA: Diagnosis present

## 2018-04-01 LAB — BASIC METABOLIC PANEL
Anion gap: 10 (ref 5–15)
BUN: 13 mg/dL (ref 6–20)
CO2: 22 mmol/L (ref 22–32)
CREATININE: 0.86 mg/dL (ref 0.61–1.24)
Calcium: 8.2 mg/dL — ABNORMAL LOW (ref 8.9–10.3)
Chloride: 107 mmol/L (ref 98–111)
GFR calc Af Amer: >60 mL/min (ref >60)
GFR calc non Af Amer: >60 mL/min (ref >60)
Glucose, Bld: 93 mg/dL (ref 70–99)
Potassium: 3.5 mmol/L (ref 3.5–5.1)
Sodium: 139 mmol/L (ref 135–145)

## 2018-04-01 LAB — CBC
HCT: 41.7 % (ref 39.0–52.0)
Hemoglobin: 13.5 g/dL (ref 13.0–17.0)
MCH: 28.5 pg (ref 26.0–34.0)
MCHC: 32.4 g/dL (ref 30.0–36.0)
MCV: 88.2 fL (ref 80.0–100.0)
Platelets: 258 10*3/uL (ref 150–400)
RBC: 4.73 MIL/uL (ref 4.22–5.81)
RDW: 13.1 % (ref 11.5–15.5)
WBC: 9.8 10*3/uL (ref 4.0–10.5)
nRBC: 0 % (ref 0.0–0.2)

## 2018-04-01 MED ORDER — ACETAMINOPHEN 325 MG PO TABS
650.0000 mg | ORAL_TABLET | Freq: Four times a day (QID) | ORAL | Status: DC | PRN
  Administered 2018-04-01: 650 mg via ORAL
  Filled 2018-04-01: qty 2

## 2018-04-01 MED ORDER — DIATRIZOATE MEGLUMINE & SODIUM 66-10 % PO SOLN
90.0000 mL | Freq: Once | ORAL | Status: AC
  Administered 2018-04-01: 90 mL via NASOGASTRIC
  Filled 2018-04-01: qty 90

## 2018-04-01 NOTE — Progress Notes (Signed)
   Subjective/Chief Complaint: Still with RLQ abdominal pain but less Passed some flatus C/o headache   Objective: Vital signs in last 24 hours: Temp:  [97.7 F (36.5 C)-99.6 F (37.6 C)] 98.9 F (37.2 C) (01/05 0619) Pulse Rate:  [78-96] 82 (01/05 0619) Resp:  [15-20] 17 (01/05 0619) BP: (117-135)/(61-86) 121/61 (01/05 0619) SpO2:  [94 %-100 %] 98 % (01/05 0619) Last BM Date: 03/30/18  Intake/Output from previous day: 01/04 0701 - 01/05 0700 In: 2480 [I.V.:1980; IV Piggyback:500] Out: 1530 [Urine:350; Emesis/NG output:1180] Intake/Output this shift: No intake/output data recorded.  Exam: Awake and alert Abdomen softer, less full, minimally tender  Lab Results:  Recent Labs    03/31/18 0435 04/01/18 0441  WBC 14.1* 9.8  HGB 16.5 13.5  HCT 48.5 41.7  PLT 336 258   BMET Recent Labs    03/31/18 0435 04/01/18 0441  NA 138 139  K 3.5 3.5  CL 104 107  CO2 22 22  GLUCOSE 129* 93  BUN 13 13  CREATININE 0.91 0.86  CALCIUM 9.5 8.2*   PT/INR No results for input(s): LABPROT, INR in the last 72 hours. ABG No results for input(s): PHART, HCO3 in the last 72 hours.  Invalid input(s): PCO2, PO2  Studies/Results: Dg Abdomen 1 View  Result Date: 03/31/2018 CLINICAL DATA:  NG tube placement EXAM: ABDOMEN - 1 VIEW COMPARISON:  03/31/2018 FINDINGS: NG tube is in place. The tip projects over the duodenal bulb. Mildly distended proximal small bowel loops are noted. No obvious free intraperitoneal gas. IMPRESSION: NG tube tip projects over the duodenal bulb. Electronically Signed   By: Jolaine ClickArthur  Hoss M.D.   On: 03/31/2018 10:22   Ct Abdomen Pelvis W Contrast  Result Date: 03/31/2018 CLINICAL DATA:  Right lower quadrant abdominal pain. Symptoms began yesterday. Previous appendectomy. EXAM: CT ABDOMEN AND PELVIS WITH CONTRAST TECHNIQUE: Multidetector CT imaging of the abdomen and pelvis was performed using the standard protocol following bolus administration of intravenous  contrast. CONTRAST:  100mL ISOVUE-300 IOPAMIDOL (ISOVUE-300) INJECTION 61% COMPARISON:  None. FINDINGS: Lower chest: Normal Hepatobiliary: Mild fatty change of the liver. No focal lesion. No calcified gallstones. Pancreas: Normal Spleen: Normal Adrenals/Urinary Tract: Adrenal glands are normal. Kidneys are normal. Bladder is normal. Stomach/Bowel: There is acute small bowel obstruction. 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 There is a fluid-filled structure in an inguinal hernia on the right that I assume is small intestine. Is difficult to follow this into the hernia of but I think this is intestine. I am not 100% certain of that however. Distal small intestine appears collapsed. Vascular/Lymphatic: Normal Reproductive: Normal Other: Tiny amount of free fluid, often seen in acute small bowel obstruction. No free air. Musculoskeletal: Mild spinal curvature. IMPRESSION: Acute small bowel obstruction, favored secondary to an incarcerated right inguinal hernia. It is difficult to follow the small intestine into the hernia, but I think that is what is going on here. Electronically Signed   By: Paulina FusiMark  Shogry M.D.   On: 03/31/2018 07:37    Anti-infectives: Anti-infectives (From admission, onward)   None      Assessment/Plan: SBO  Will start SBO protocol xray today Continue NG and NPO   LOS: 0 days    Gordon MiyamotoDouglas Makaylin Carlo 04/01/2018

## 2018-04-01 NOTE — Progress Notes (Signed)
Dr. Maisie Fus aware of results of Xray post SB protocol. Pt feeling better, having had multiple loose BM's during the shift and expelling lots of flatus. Recently had small regular BM. Denies pain at rest and mild pain with ambulation. See new orders to remove NGT and start clears. Pt elated.

## 2018-04-02 NOTE — Progress Notes (Signed)
Pt alert and oriented. Tolerating diet.  D/C instructions given and all questions answered. Pt d/cd home.

## 2018-04-02 NOTE — Discharge Summary (Signed)
     Patient ID: Gordon Turner [id number] [birth year] 33 y.o.  Admit date: 03/31/2018 Discharge date: 04/02/2018  Admitting Diagnosis: SBO HIV  Discharge Diagnosis Patient Active Problem List   Diagnosis Date Noted  . SBO (small bowel obstruction) (HCC) 03/31/2018    Consultants none  Reason for Admission: This is a [age] year old gentleman who presents with the sudden onset of right-sided abdominal pain.  He was at work and had a normal bowel movement and then several hours later started having nausea, vomiting, and abdominal pain.  He presented to the emergency department.  A CT scan of the abdomen and pelvis was performed showing findings consistent with a small bowel obstruction.  There was a right inguinal hernia but radiology was uncertain whether this contained bowel or just fluid.  He has a prior history of a bowel resection as a premature newborn.  He has no previous history of bowel obstructions.  Procedures none  Hospital Course:  The patient was admitted and had an NGT placed.  He underwent the SBO protocol and his 8hr delay films revealed contrast in his colon to the level of the rectum.  He began having BMs and passing flatus.  his NGT was removed and his diet was adv as tolerates.  He was stable at this time for DC home.    Physical Exam: Heart: regular Lungs: CTAB Abd: soft, NT, ND, +BS  Allergies as of 04/02/2018      Reactions   Amoxicillin Anaphylaxis   Penicillins Anaphylaxis, Hives, Swelling, Other (See Comments)   DID THE REACTION INVOLVE: Swelling of the face/tongue/throat, SOB, or low BP? No Sudden or severe rash/hives, skin peeling, or the inside of the mouth or nose? No Did it require medical treatment? No When did it last happen?baby If all above answers are "NO", may proceed with cephalosporin use.      Medication List    TAKE these medications   bictegravir-emtricitabine-tenofovir AF 50-200-25 MG Tabs tablet Commonly known as:  BIKTARVY Take 1  tablet by mouth daily.   diphenhydrAMINE 25 MG tablet Commonly known as:  BENADRYL 1 to 2 tablets every 6 hours for itching and swelling   famotidine 20 MG tablet Commonly known as:  PEPCID 1 tablet twice daily for 3 to 4 days.   predniSONE 20 MG tablet Commonly known as:  DELTASONE 2 tabs po daily x 3 days   SYMBICORT 80-4.5 MCG/ACT inhaler Generic drug:  budesonide-formoterol Take 2 puffs by mouth 2 (two) times daily.   VENTOLIN HFA 108 (90 Base) MCG/ACT inhaler Generic drug:  albuterol Inhale 2 puffs into the lungs every 4 (four) hours as needed for wheezing.        Follow-up Information    Follow up with your primary care provider as needed Follow up.           Signed: Barnetta ChapelKelly Tayleigh Wetherell, [facility] 04/02/2018, 10:19 AM Pager: [phone]

## 2018-04-02 NOTE — Discharge Instructions (Signed)
Bowel Obstruction °A bowel obstruction means that something is blocking the small or large bowel. The bowel is also called the intestine. It is the long tube that connects the stomach to the opening of the butt (anus). When something blocks the bowel, food and fluids cannot pass through like normal. This condition needs to be treated. Treatment depends on the cause of the problem and how bad the problem is. °What are the causes? °Common causes of this condition include: °· Scar tissue (adhesions) from past surgery or from high-energy X-rays (radiation). °· Recent surgery in the belly. This affects how food moves in the bowel. °· Some diseases, such as: °? Irritation of the lining of the digestive tract (Crohn's disease). °? Irritation of small pouches in the bowel (diverticulitis). °· Growths or tumors. °· A bulging organ (hernia). °· Twisting of the bowel (volvulus). °· A foreign body. °· Slipping of a part of the bowel into another part (intussusception). °What are the signs or symptoms? °Symptoms of this condition include: °· Pain in the belly. °· Feeling sick to your stomach (nauseous). °· Throwing up (vomiting). °· Bloating in the belly. °· Being unable to pass gas. °· Trouble pooping (constipation). °· Watery poop (diarrhea). °· A lot of belching. °How is this diagnosed? °This condition may be diagnosed based on: °· A physical exam. °· Medical history. °· Imaging tests, such as X-ray or CT scan. °· Blood tests. °· Urine tests. °How is this treated? °Treatment for this condition may include: °· Fluids and pain medicines that are given through an IV tube. Your doctor may tell you not to eat or drink if you feel sick to your stomach and are throwing up. °· Eating a clear liquid diet for a few days. °· Putting a small tube (nasogastric tube) into the stomach. This will help with pain, discomfort, and nausea by removing blocked air and fluids from the stomach. °· Surgery. This may be needed if other treatments do  not work. °Follow these instructions at home: °Medicines °· Take over-the-counter and prescription medicines only as told by your doctor. °· If you were prescribed an antibiotic medicine, take it as told by your doctor. Do not stop taking the antibiotic even if you start to feel better. °General instructions °· Follow your diet as told by your doctor. You may need to: °? Only drink clear liquids until you start to get better. °? Avoid solid foods. °· Return to your normal activities as told by your doctor. Ask your doctor what activities are safe for you. °· Do not sit for a long time without moving. Get up to take short walks every 1-2 hours. This is important. Ask for help if you feel weak or unsteady. °· Keep all follow-up visits as told by your doctor. This is important. °How is this prevented? °After having a bowel obstruction, you may be more likely to have another. You can do some things to stop it from happening again. °· If you have a long-term (chronic) disease, contact your doctor if you see changes or problems. °· Take steps to prevent or treat trouble pooping. Your doctor may ask that you: °? Drink enough fluid to keep your pee (urine) pale yellow. °? Take over-the-counter or prescription medicines. °? Eat foods that are high in fiber. These include beans, whole grains, and fresh fruits and vegetables. °? Limit foods that are high in fat and sugar. These include fried or sweet foods. °· Stay active. Ask your doctor which exercises are   safe for you. °· Avoid stress. °· Eat three small meals and three small snacks each day. °· Work with a food expert (dietitian) to make a meal plan that works for you. °· Do not use any products that contain nicotine or tobacco, such as cigarettes and e-cigarettes. If you need help quitting, ask your doctor. °Contact a doctor if: °· You have a fever. °· You have chills. °Get help right away if: °· You have pain or cramps that get worse. °· You throw up blood. °· You are  sick to your stomach. °· You cannot stop throwing up. °· You cannot drink fluids. °· You feel mixed up (confused). °· You feel very thirsty (dehydrated). °· Your belly gets more bloated. °· You feel weak or you pass out (faint). °Summary °· A bowel obstruction means that something is blocking the small or large bowel. °· Treatment may include IV fluids and pain medicine. You may also have a clear liquid diet, a small tube in your stomach, or surgery. °· Drink clear liquids and avoid solid foods until you get better. °This information is not intended to replace advice given to you by your health care provider. Make sure you discuss any questions you have with your health care provider. °Document Released: 04/21/2004 Document Revised: 07/26/2017 Document Reviewed: 07/26/2017 °Elsevier Interactive Patient Education © 2019 [employer] ° °

## 2018-04-05 DIAGNOSIS — M546 Pain in thoracic spine: Secondary | ICD-10-CM | POA: Diagnosis not present

## 2018-04-05 DIAGNOSIS — M419 Scoliosis, unspecified: Secondary | ICD-10-CM | POA: Diagnosis not present

## 2018-04-05 DIAGNOSIS — M542 Cervicalgia: Secondary | ICD-10-CM | POA: Diagnosis not present

## 2019-02-16 ENCOUNTER — Ambulatory Visit (INDEPENDENT_AMBULATORY_CARE_PROVIDER_SITE_OTHER)
Admission: RE | Admit: 2019-02-16 | Discharge: 2019-02-16 | Disposition: A | Discharge status: Home / Self Care | Payer: BC Managed Care – PPO | Source: Ambulatory Visit

## 2019-02-16 DIAGNOSIS — R519 Headache, unspecified: Secondary | ICD-10-CM | POA: Diagnosis not present

## 2019-02-16 MED ORDER — ALBUTEROL SULFATE HFA 108 (90 BASE) MCG/ACT IN AERS: 2.0000 | INHALATION_SPRAY | RESPIRATORY_TRACT | 0 refills | Status: DC | PRN

## 2019-02-16 NOTE — Discharge Instructions (Addendum)
Continue taking daily allergy medication: Recommend adding Flonase/Nasacort to this regimen. Reintroduce moisture to the air with steamy showers, humidifier, etc. Go to ER for further evaluation for worse headache of life, change in vision, one-sided weakness, slurred speech, facial droop.  Drive-through COVID testing is done at [facility] on [address] Monday thru Friday 10AM-3PM

## 2019-02-16 NOTE — ED Provider Notes (Signed)
Virtual Visit via Video Note:  Gordon Turner  initiated request for Telemedicine visit with [facility] Urgent [facility] team. I connected with Gordon Turner  on 02/16/2019 at 9:15 AM  for a synchronized telemedicine visit using a video enabled HIPPA compliant telemedicine application. I verified that I am speaking with Gordon Turner  using two identifiers. [place] Hall-Potvin, PA-C  was physically located in a [facility] Urgent care site and Ruhan Borak was located at a different location.   The limitations of evaluation and management by telemedicine as well as the availability of in-person appointments were discussed. Patient was informed that he  may incur a bill ( including co-pay) for this virtual visit encounter. Gordon Turner  expressed understanding and gave verbal consent to proceed with virtual visit.     History of Present Illness:Gordon Turner  is a 33 y.o. male presents with frontal headache for the last 2 to 3 days.  Patient has taken 1000 mg of Tylenol 2-3 times daily with relief.  Patient denies aura, visual changes, worst headache of life, thunderclap headache.  Patient largely concerned if he could have Covid: States his boyfriend, with whom he lives, tested positive in August and his first symptom was headache.  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  Patient also requesting refill on asthma medication as he ran out of his pump a few months ago and states this tends to flare during season change.  Patient compliant with daily antihistamines, not using intranasal steroid spray.   Review of Systems  Constitutional: Negative for fever and malaise/fatigue.  HENT: Negative for ear pain, hearing loss, sinus pain, sore throat and tinnitus.   Eyes: Negative for blurred vision, double vision, photophobia, pain, discharge and redness.  Respiratory: Negative for cough, shortness of breath and  wheezing.   Cardiovascular: Negative for chest pain and palpitations.  Gastrointestinal: Negative for abdominal pain, diarrhea and vomiting.  Musculoskeletal: Negative for joint pain and myalgias.  Skin: Negative for itching and rash.  Neurological: Positive for headaches. Negative for dizziness, tingling, tremors, sensory change, speech change, focal weakness, seizures, loss of consciousness and weakness.  Endo/Heme/Allergies: Positive for environmental allergies. Does not bruise/bleed easily.    Past Medical History:  Diagnosis Date  . Asthma   . HIV (human immunodeficiency virus infection) (HCC)     Allergies  Allergen Reactions  . Amoxicillin Anaphylaxis  . Penicillins Anaphylaxis, Hives, Swelling and Other (See Comments)    DID THE REACTION INVOLVE: Swelling of the face/tongue/throat, SOB, or low BP? No Sudden or severe rash/hives, skin peeling, or the inside of the mouth or nose? No Did it require medical treatment? No When did it last happen?baby If all above answers are "NO", may proceed with cephalosporin use.        Observations/Objective: 33 year old male Sitting in no acute distress.  Patient is able to speak in full sentences without coughing, sneezing, wheezing.  AxO x 3  Assessment and Plan: Frontal headache likely contributed by seasonal allergies/weather change.  Patient to continue antihistamine, add intranasal steroid to current therapy.  May continue Tylenol, ibuprofen as needed. Covid testing deferred at this time as patient has not had any known exposures, works from home, has no other symptoms.  Follow Up Instructions: Patient to go to ER for alarm symptoms as outlined in patient instructions. Patient to present to [facility] drive-through testing facility should he develop  adventitious symptoms more concerning for Covid such as fever, cough, shortness of breath.   I discussed the assessment and treatment plan with the patient. The patient was  provided an opportunity to ask questions and all were answered. The patient agreed with the plan and demonstrated an understanding of the instructions.   The patient was advised to call back or seek an in-person evaluation if the symptoms worsen or if the condition fails to improve as anticipated.  I provided 15 minutes of non-face-to-face time during this encounter.    [place], PA-C  02/16/2019 9:15 AM        Hall-Potvin, [place], PA-C 02/16/19 0915

## 2019-03-14 ENCOUNTER — Other Ambulatory Visit: Payer: Self-pay

## 2019-03-14 ENCOUNTER — Ambulatory Visit (INDEPENDENT_AMBULATORY_CARE_PROVIDER_SITE_OTHER)
Admission: RE | Admit: 2019-03-14 | Discharge: 2019-03-14 | Disposition: A | Discharge status: Home / Self Care | Payer: BC Managed Care – PPO | Source: Ambulatory Visit

## 2019-03-14 DIAGNOSIS — R519 Headache, unspecified: Secondary | ICD-10-CM | POA: Diagnosis not present

## 2019-03-14 DIAGNOSIS — R5383 Other fatigue: Secondary | ICD-10-CM | POA: Diagnosis not present

## 2019-03-14 DIAGNOSIS — J4521 Mild intermittent asthma with (acute) exacerbation: Secondary | ICD-10-CM | POA: Diagnosis not present

## 2019-03-14 DIAGNOSIS — Z20822 Contact with and (suspected) exposure to covid-19: Secondary | ICD-10-CM

## 2019-03-14 DIAGNOSIS — R6889 Other general symptoms and signs: Secondary | ICD-10-CM

## 2019-03-14 DIAGNOSIS — Z20828 Contact with and (suspected) exposure to other viral communicable diseases: Secondary | ICD-10-CM

## 2019-03-14 DIAGNOSIS — R110 Nausea: Secondary | ICD-10-CM | POA: Diagnosis not present

## 2019-03-14 MED ORDER — ALBUTEROL SULFATE HFA 108 (90 BASE) MCG/ACT IN AERS: 1.0000 | INHALATION_SPRAY | Freq: Four times a day (QID) | RESPIRATORY_TRACT | 0 refills | Status: DC | PRN

## 2019-03-14 MED ORDER — PREDNISONE 10 MG (21) PO TBPK: ORAL_TABLET | Freq: Every day | ORAL | 0 refills | Status: DC

## 2019-03-14 MED ORDER — ONDANSETRON HCL 4 MG PO TABS: 4.0000 mg | ORAL_TABLET | Freq: Four times a day (QID) | ORAL | 0 refills | Status: DC

## 2019-03-14 NOTE — ED Provider Notes (Signed)
[place] Virtual Visit via Video Note:  Gordon Turner  initiated request for Telemedicine visit with [facility] Urgent Care team. I connected with Gordon Turner  on 03/14/2019 at 3:42 PM  for a synchronized telemedicine visit using a video enabled HIPPA compliant telemedicine application. I verified that I am speaking with Gordon Turner  using two identifiers. Gordon Box, PA-C  was physically located in a [facility] Urgent care site and Gordon Turner was located at a different location.   The limitations of evaluation and management by telemedicine as well as the availability of in-person appointments were discussed. Patient was informed that he  may incur a bill ( including co-pay) for this virtual visit encounter. Gordon Turner  expressed understanding and gave verbal consent to proceed with virtual visit.   [id number] 03/14/19 Arrival Time: 1529  CC: COVID symptoms  SUBJECTIVE: History from: patient.  Gordon Turner is a 33 y.o. male hx significant for asthma and HIV, who presents with fatigue, HA, decreased appetite, and nausea x 8 days.  Denies sick exposure to COVID, flu or strep.  Denies recent travel.  Has tried OTC tylenol and zofran with minimal relief.  Symptoms are made worse upon waking.  Denies previous symptoms in the past.   Possible subjective fever at home (does not have thermometer), nasal congestion, SOB, mild dry cough, 3 episodes of vomiting 2-3 days ago, and mild diarrhea.  Denies fever, chills, rhinorrhea, sore throat, wheezing, chest pain, changes in bladder habits.     ROS: As per HPI.  All other pertinent ROS negative.     Past Medical History:  Diagnosis Date  . Asthma   . HIV (human immunodeficiency virus infection) ([place])    History reviewed. No pertinent surgical history. Allergies  Allergen Reactions  . Amoxicillin Anaphylaxis  . Penicillins Anaphylaxis, Hives, Swelling and Other (See Comments)    DID THE REACTION INVOLVE: Swelling of the  face/tongue/throat, SOB, or low BP? No Sudden or severe rash/hives, skin peeling, or the inside of the mouth or nose? No Did it require medical treatment? No When did it last happen?baby If all above answers are "NO", may proceed with cephalosporin use.   No current facility-administered medications on file prior to encounter.   Current Outpatient Medications on File Prior to Encounter  Medication Sig Dispense Refill  . bictegravir-emtricitabine-tenofovir AF (BIKTARVY) 50-200-25 MG TABS tablet Take 1 tablet by mouth daily.    . budesonide-formoterol (SYMBICORT) 80-4.5 MCG/ACT inhaler Take 2 puffs by mouth 2 (two) times daily.    . diphenhydrAMINE (BENADRYL) 25 MG tablet 1 to 2 tablets every 6 hours for itching and swelling 20 tablet 0  . famotidine (PEPCID) 20 MG tablet 1 tablet twice daily for 3 to 4 days. 30 tablet 0    OBJECTIVE:   There were no vitals filed for this visit.  General appearance: alert; appears mildly fatigued, no distress Eyes: EOMI grossly HENT: normocephalic; atraumatic Neck: supple with FROM Lungs: normal respiratory effort; speaking in full sentences without difficulty Extremities: moves extremities without difficulty Skin: No obvious rashes Neurologic: No facial asymmetries Psychological: alert and cooperative; normal mood and affect  ASSESSMENT & PLAN:  1. Encounter by telehealth for suspected COVID-19   2. Flu-like symptoms   3. Mild intermittent asthma with exacerbation     Meds ordered this encounter  Medications  . predniSONE (STERAPRED UNI-PAK 21 TAB) 10 MG (21) TBPK tablet    Sig: Take by mouth daily. Take 6 tabs by mouth daily  for 2 days, then 5 tabs for 2 days, then 4 tabs for 2 days, then 3 tabs for 2 days, 2 tabs for 2 days, then 1 tab by mouth daily for 2 days    Dispense:  42 tablet    Refill:  0    Order Specific Question:   Supervising Provider    Answer:   Eustace Moore [[id number]]  . albuterol (VENTOLIN HFA) 108 (90 Base)  MCG/ACT inhaler    Sig: Inhale 1-2 puffs into the lungs every 6 (six) hours as needed for wheezing or shortness of breath.    Dispense:  18 g    Refill:  0    Order Specific Question:   Supervising Provider    Answer:   Eustace Moore [[id number]]  . ondansetron (ZOFRAN) 4 MG tablet    Sig: Take 1 tablet (4 mg total) by mouth every 6 (six) hours.    Dispense:  12 tablet    Refill:  0    Order Specific Question:   Supervising Provider    Answer:   Eustace Moore [[id number]]    COVID testing ordered.  You may text "COVID" to [phone], call [phone], OR  log on to [email or web address] to make an appointment  In the meantime: You should remain isolated in your home for 10 days from symptom onset AND greater than 72 hours after symptoms resolution (absence of fever without the use of fever-reducing medication and improvement in respiratory symptoms), whichever is longer Get plenty of rest and push fluids Prednisone prescribed for possible asthma flare Albuterol inhaler as needed for shortness of breath and/or wheezing Zofran as needed for nausea and vomiting You may use OTC zyrtec for nasal congestion, runny nose, and/or sore throat You may use OTC flonase as needed for nasal congestion and runny nose Use OTC medications like ibuprofen or tylenol as needed fever or pain Follow up with [facility] to establish care and for recheck. Call or go to the ED if you have any new or worsening symptoms such as fever, cough, shortness of breath, chest tightness, chest pain, turning blue, changes in mental status, etc...   I discussed the assessment and treatment plan with the patient. The patient was provided an opportunity to ask questions and all were answered. The patient agreed with the plan and demonstrated an understanding of the instructions.   The patient was advised to call back or seek an in-person evaluation if the symptoms worsen or if the condition fails to  improve as anticipated.  I provided 10 minutes of non-face-to-face time during this encounter.  Rennis Harding, PA-C  03/14/2019 3:42 PM          Rennis Harding, PA-C 03/14/19 1543

## 2019-03-14 NOTE — Discharge Instructions (Addendum)
COVID testing ordered.  You may text "COVID" to [phone], call [phone], OR  log on to [email or web address] to make an appointment  In the meantime: You should remain isolated in your home for 10 days from symptom onset AND greater than 72 hours after symptoms resolution (absence of fever without the use of fever-reducing medication and improvement in respiratory symptoms), whichever is longer Get plenty of rest and push fluids Prednisone prescribed for possible asthma flare Albuterol inhaler as needed for shortness of breath and/or wheezing Zofran as needed for nausea and vomiting You may use OTC zyrtec for nasal congestion, runny nose, and/or sore throat You may use OTC flonase as needed for nasal congestion and runny nose Use OTC medications like ibuprofen or tylenol as needed fever or pain Follow up with [facility] to establish care and for recheck. Call or go to the ED if you have any new or worsening symptoms such as fever, cough, shortness of breath, chest tightness, chest pain, turning blue, changes in mental status, etc..Marland Kitchen

## 2019-03-17 DIAGNOSIS — Z20828 Contact with and (suspected) exposure to other viral communicable diseases: Secondary | ICD-10-CM | POA: Diagnosis not present

## 2019-03-17 DIAGNOSIS — Z03818 Encounter for observation for suspected exposure to other biological agents ruled out: Secondary | ICD-10-CM | POA: Diagnosis not present

## 2019-03-19 ENCOUNTER — Other Ambulatory Visit: Payer: BC Managed Care – PPO

## 2019-03-28 IMAGING — CT CT ABD-PELV W/ CM
2 of 4 series · 17 of 46 positions shown, 19 images · IV contrast (ISOVUE)
Comparison: None.

CLINICAL DATA: Right lower quadrant abdominal pain. Symptoms began
yesterday. Previous appendectomy.

EXAM:
CT ABDOMEN AND PELVIS WITH CONTRAST
TECHNIQUE: Multidetector CT imaging of the abdomen and pelvis was performed
using the standard protocol following bolus administration of
intravenous contrast.
CONTRAST:  100mL 3N5R9J-[id number] IOPAMIDOL ([id number]-[id number]) INJECTION 61%

[Series 2: axial st · axial · 0.86mm/px · z∈[+936,+1351]mm · 14 of 93 slices shown, 16 images]
[im 5/93  soft-tissue]
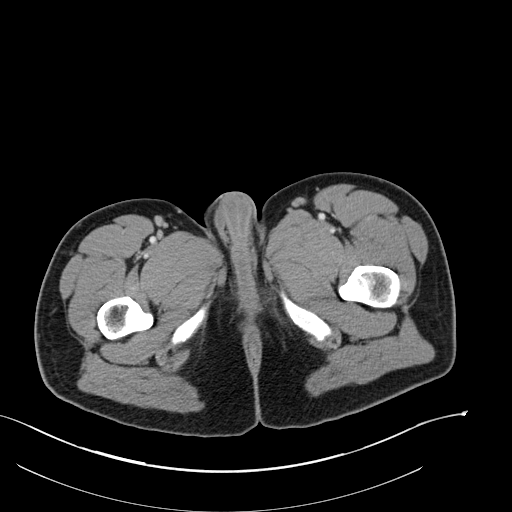
[im 5/93  bone]
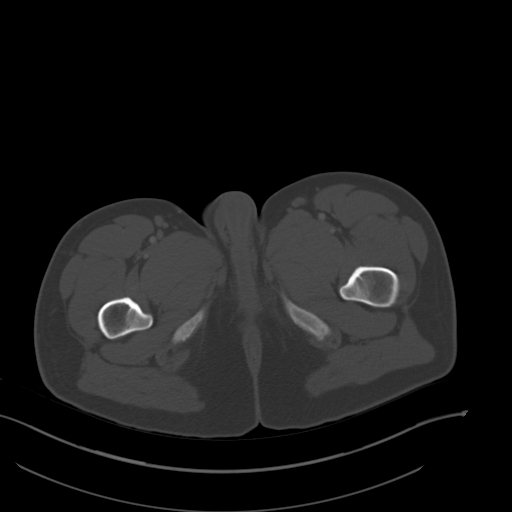
[im 10/93  soft-tissue]
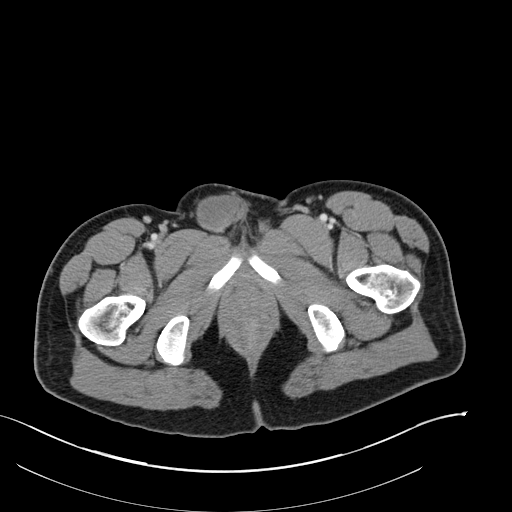
[im 20/93  soft-tissue]
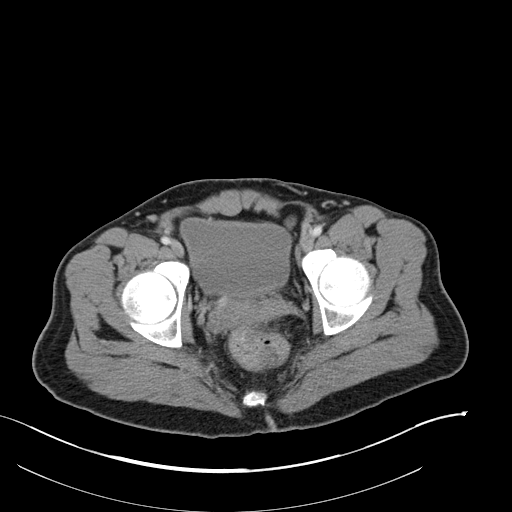
[im 25/93  soft-tissue]
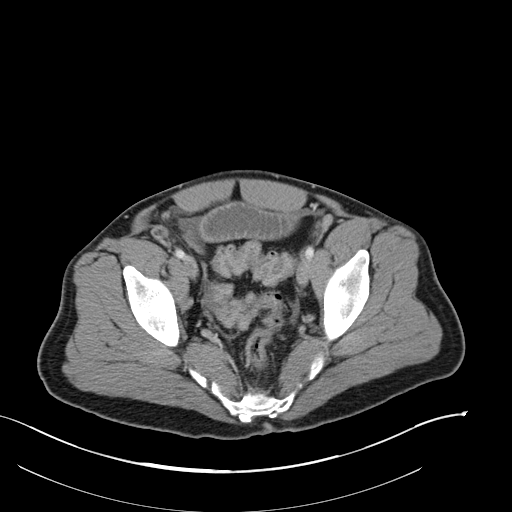
[im 30/93  soft-tissue]
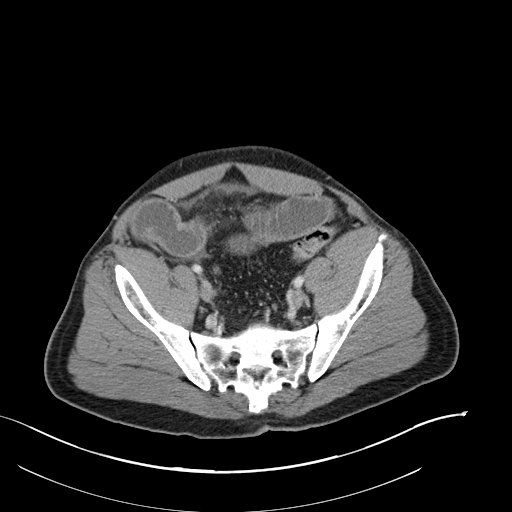
[im 39/93  soft-tissue]
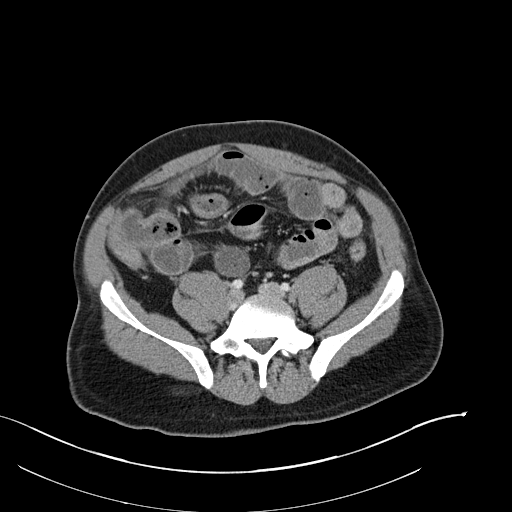
[im 44/93  soft-tissue]
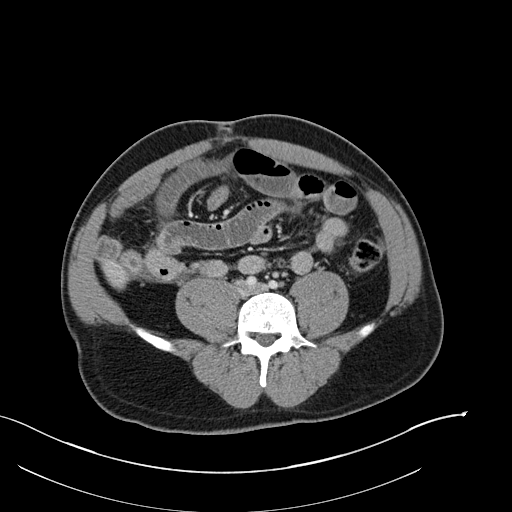
[im 49/93  soft-tissue]
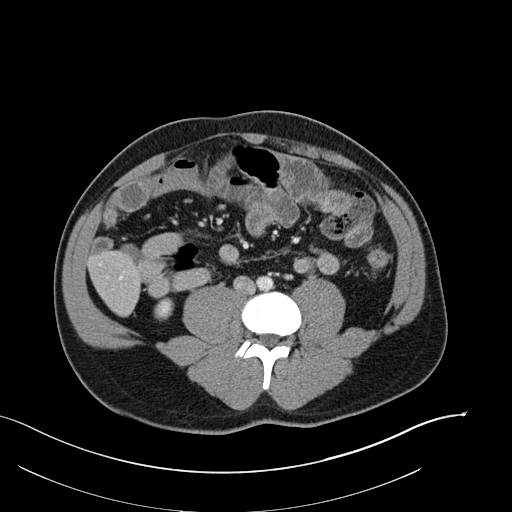
[im 54/93  soft-tissue]
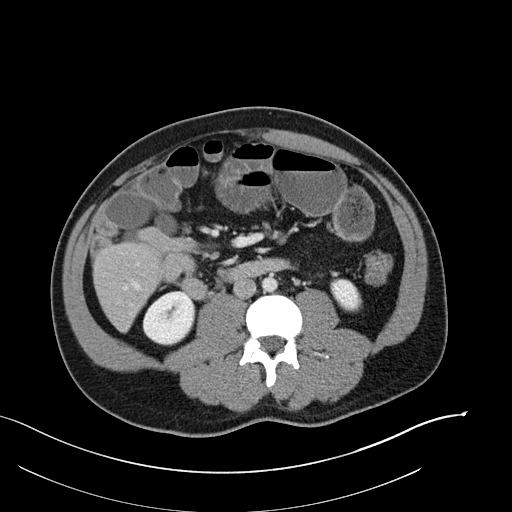
[im 54/93  bone]
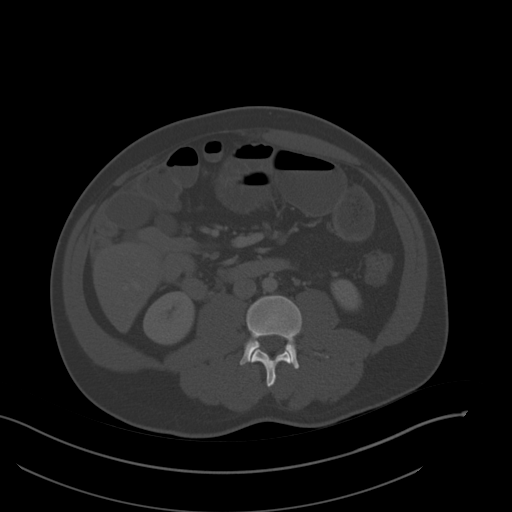
[im 63/93  soft-tissue]
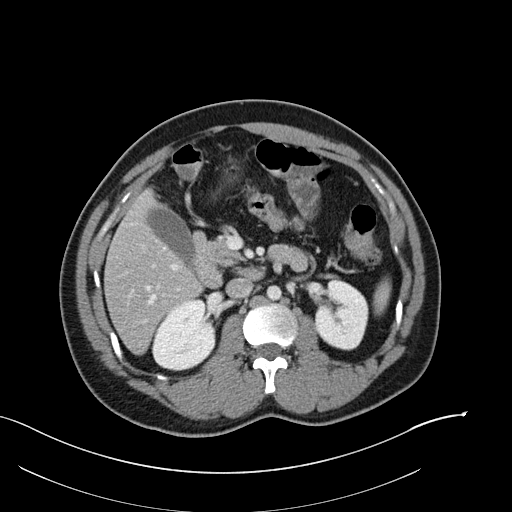
[im 68/93  soft-tissue]
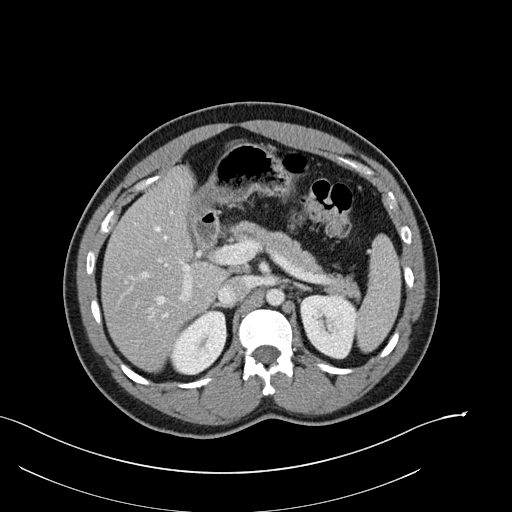
[im 73/93  soft-tissue]
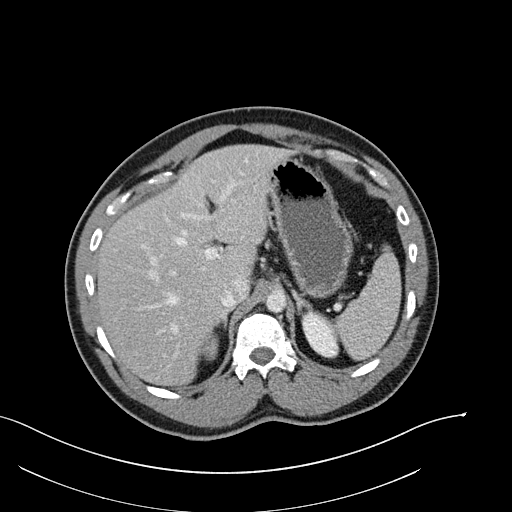
[im 83/93  soft-tissue]
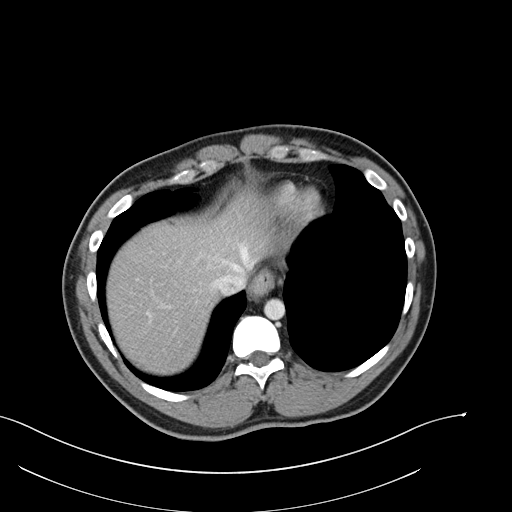
[im 88/93  soft-tissue]
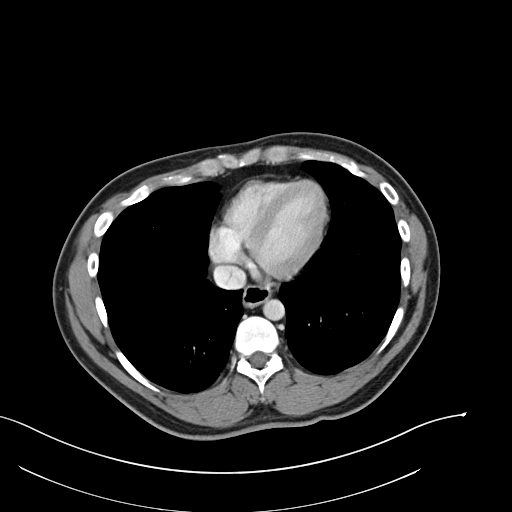

[Series 5: coronal st · coronal · 0.88mm/px · 3 of 101 slices shown]
[im 34/101  soft-tissue]
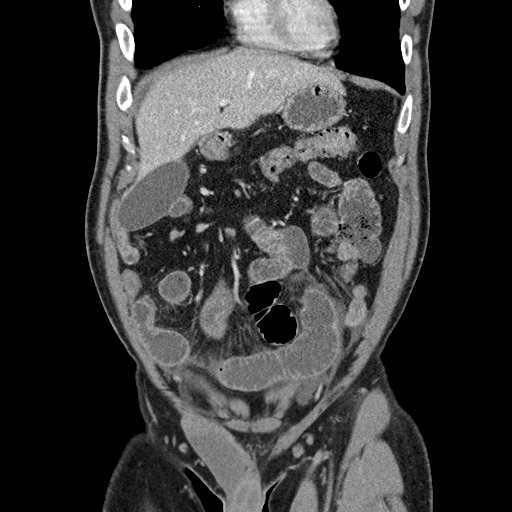
[im 45/101  soft-tissue]
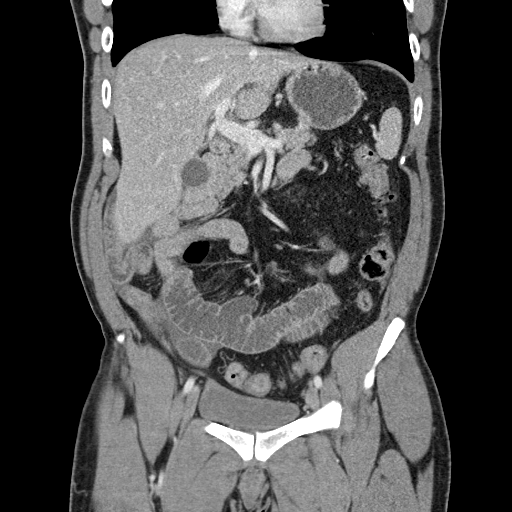
[im 56/101  soft-tissue]
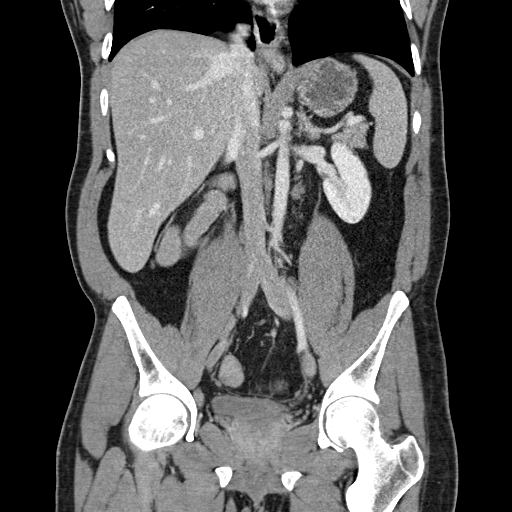

[17 of 46 positions shown; findings below may reference images not displayed]

FINDINGS: Lower chest: Normal

Hepatobiliary: Mild fatty change of the liver. No focal lesion. No
calcified gallstones.

Pancreas: Normal

Spleen: Normal

Adrenals/Urinary Tract: Adrenal glands are normal. Kidneys are
normal. Bladder is normal.

Stomach/Bowel: There is acute small bowel obstruction. The patient
appears to have congenital malrotation of the intestine. There is a
fluid-filled structure in an inguinal hernia on the right that I
assume is small intestine. Is difficult to follow this into the
hernia of but I think this is intestine. I am not 100% certain of
that however. Distal small intestine appears collapsed.

Vascular/Lymphatic: Normal

Reproductive: Normal

Other: Tiny amount of free fluid, often seen in acute small bowel
obstruction. No free air.

Musculoskeletal: Mild spinal curvature.
IMPRESSION: Acute small bowel obstruction, favored secondary to an incarcerated
right inguinal hernia. It is difficult to follow the small intestine
into the hernia, but I think that is what is going on here.

## 2019-04-25 DIAGNOSIS — M19072 Primary osteoarthritis, left ankle and foot: Secondary | ICD-10-CM | POA: Diagnosis not present

## 2019-04-25 DIAGNOSIS — M19071 Primary osteoarthritis, right ankle and foot: Secondary | ICD-10-CM | POA: Diagnosis not present

## 2019-04-25 DIAGNOSIS — B351 Tinea unguium: Secondary | ICD-10-CM | POA: Diagnosis not present

## 2019-05-09 ENCOUNTER — Ambulatory Visit (INDEPENDENT_AMBULATORY_CARE_PROVIDER_SITE_OTHER)
Admission: RE | Admit: 2019-05-09 | Discharge: 2019-05-09 | Disposition: A | Discharge status: Home / Self Care | Payer: BC Managed Care – PPO | Source: Ambulatory Visit

## 2019-05-09 ENCOUNTER — Telehealth: Payer: BC Managed Care – PPO | Admitting: Emergency Medicine

## 2019-05-09 DIAGNOSIS — J4540 Moderate persistent asthma, uncomplicated: Secondary | ICD-10-CM | POA: Diagnosis not present

## 2019-05-09 DIAGNOSIS — J45901 Unspecified asthma with (acute) exacerbation: Secondary | ICD-10-CM

## 2019-05-09 MED ORDER — PREDNISONE 20 MG PO TABS: 40.0000 mg | ORAL_TABLET | Freq: Every day | ORAL | 0 refills | Status: DC

## 2019-05-09 MED ORDER — COMBIVENT RESPIMAT 20-100 MCG/ACT IN AERS: 1.0000 | INHALATION_SPRAY | Freq: Four times a day (QID) | RESPIRATORY_TRACT | 0 refills | Status: AC

## 2019-05-09 MED ORDER — ALBUTEROL SULFATE HFA 108 (90 BASE) MCG/ACT IN AERS: 1.0000 | INHALATION_SPRAY | Freq: Four times a day (QID) | RESPIRATORY_TRACT | 1 refills | Status: DC | PRN

## 2019-05-09 NOTE — Discharge Instructions (Addendum)
Use the Combivent inhaler as directed.    Follow-up with your primary care provider or come here to be seen in person if your symptoms persist or worsen.

## 2019-05-09 NOTE — Progress Notes (Signed)
Visit for Asthma  Based on what you have shared with me, it looks like you may have a flare up of your asthma.    I can prescribe you some prednisone and additional rescue inhalers.  If these treatments do not work, you will need to seek an in-person visit.  Additionally, if you run a fever you should be seen in-person.  Asthma is a chronic (ongoing) lung disease which results in airway obstruction, inflammation and hyper-responsiveness.   Asthma symptoms vary from person to person, with common symptoms including nighttime awakening and decreased ability to participate in normal activities as a result of shortness of breath. It is often triggered by changes in weather, changes in the season, changes in air temperature, or inside (home, school, daycare or work) allergens such as animal dander, mold, mildew, woodstoves or cockroaches.   It can also be triggered by hormonal changes, extreme emotion, physical exertion or an upper respiratory tract illness.     It is important to identify the trigger, and then eliminate or avoid the trigger if possible.   If you have been prescribed medications to be taken on a regular basis, it is important to follow the asthma action plan and to follow guidelines to adjust medication in response to increasing symptoms of decreased peak expiratory flow rate  Treatment: I have prescribed: Albuterol (Proventil HFA; Ventolin HFA) 108 (90 Base) MCG/ACT Inhaler 2 puffs into the lungs every six hours as needed for wheezing or shortness of breath and Prednisone.  HOME CARE . Only take medications as instructed by your medical team. . Consider wearing a mask or scarf to improve breathing air temperature have been shown to decrease irritation and decrease exacerbations . Get rest. . Taking a steamy shower or using a humidifier may help nasal congestion sand ease  sore throat pain. You can place a towel over your head and breathe in the steam from hot water coming from a faucet. . Using a saline nasal spray works much the same way.  . Cough drops, hare candies and sore throat lozenges may ease your cough.  . Avoid close contacts especially the very you and the elderly . Cover your mouth if you cough or sneeze . Always remember to wash your hands.    GET HELP RIGHT AWAY IF: . You develop worsening symptoms; breathlessness at rest, drowsy, confused or agitated, unable to speak in full sentences . You have coughing fits . You develop a severe headache or visual changes . You develop shortness of breath, difficulty breathing or start having chest pain . Your symptoms persist after you have completed your treatment plan . If your symptoms do not improve within 10 days  MAKE SURE YOU . Understand these instructions. . Will watch your condition. . Will get help right away if you are not doing well or get worse.   Your e-visit answers were reviewed by a board certified advanced clinical practitioner to complete your personal care plan, Depending upon the condition, your plan could have included both over the counter or prescription medications.  Please review your pharmacy choice. Your safety is important to [place]. If you have drug allergies check your prescription carefully. You can use MyChart to ask questions about today's visit, request a non-urgent call back, or ask for a work or school excuse for 24 hours related to this e-Visit. If it has been greater than 24 hours you will need to follow up with your provider, or enter a new e-Visit to address  those concerns.  You will get an e-mail in the next two days asking about your experience. I hope that your e-visit has been valuable and will speed your recovery. Thank you for using e-visits.  Approximately 5 minutes was used in reviewing the patient's chart, questionnaire, prescribing medications, and  documentation.

## 2019-05-09 NOTE — ED Provider Notes (Signed)
Virtual Visit via Video Note:  Gordon Turner  initiated request for Telemedicine visit with [facility] Urgent Care team. I connected with Gordon Turner  on 05/09/2019 at 5:12 PM  for a synchronized telemedicine visit using a video enabled HIPPA compliant telemedicine application. I verified that I am speaking with Gordon Turner  using two identifiers. Mickie Bail, NP  was physically located in a [facility] Urgent care site and Devyn Sheerin was located at a different location.   The limitations of evaluation and management by telemedicine as well as the availability of in-person appointments were discussed. Patient was informed that he  may incur a bill ( including co-pay) for this virtual visit encounter. Gordon Turner  expressed understanding and gave verbal consent to proceed with virtual visit.     History of Present Illness:Gordon Turner  is a 34 y.o. male presents for refill on his Combivent inhaler.  He reports a nonproductive cough and intermittent mild wheezing x 1 day.  He reports his asthma is worse due to the change in the weather.  He states he does not need a steroid at this time.  He denies fever, chills, shortness of breath, or other symptoms.    ROS as stated in HPI.  All other systems reviewed and negative.     Allergies  Allergen Reactions  . Amoxicillin Anaphylaxis  . Penicillins Anaphylaxis, Hives, Swelling and Other (See Comments)    DID THE REACTION INVOLVE: Swelling of the face/tongue/throat, SOB, or low BP? No Sudden or severe rash/hives, skin peeling, or the inside of the mouth or nose? No Did it require medical treatment? No When did it last happen?baby If all above answers are "NO", may proceed with cephalosporin use.     Past Medical History:  Diagnosis Date  . Asthma   . HIV (human immunodeficiency virus infection) (HCC)      Social History   Tobacco Use  . Smoking status: Never Smoker  . Smokeless tobacco: Never Used  Substance Use Topics  . Alcohol use:  Yes  . Drug use: Yes    Types: Marijuana     Observations/Objective: Physical Exam  VITALS: Patient denies fever. GENERAL: Alert, appears well and in no acute distress. HEENT: Atraumatic. NECK: Normal movements of the head and neck. CARDIOPULMONARY: No increased WOB. Speaking in clear sentences. I:E ratio WNL.  MS: Moves all visible extremities without noticeable abnormality. PSYCH: Pleasant and cooperative, well-groomed. Speech normal rate and rhythm. Affect is appropriate. Insight and judgement are appropriate. Attention is focused, linear, and appropriate.  NEURO: CN grossly intact. Oriented as arrived to appointment on time with no prompting. Moves both UE equally.  SKIN: No obvious lesions, wounds, erythema, or cyanosis noted on face or hands.    Assessment and Plan:    ICD-10-CM   1. Moderate persistent asthma, unspecified whether complicated  J45.40        Follow Up Instructions: Refill on Combivent inhaler provided.  Instructed patient to follow-up with his PCP or come here to be seen in person if his symptoms persist or worsen.  Patient agrees to plan of care.      I discussed the assessment and treatment plan with the patient. The patient was provided an opportunity to ask questions and all were answered. The patient agreed with the plan and demonstrated an understanding of the instructions.   The patient was advised to call back or seek an in-person evaluation if the symptoms worsen or if the condition fails to improve  as anticipated.      Sharion Balloon, NP  05/09/2019 5:12 PM         Sharion Balloon, NP 05/09/19 1712

## 2019-05-24 DIAGNOSIS — Z20828 Contact with and (suspected) exposure to other viral communicable diseases: Secondary | ICD-10-CM | POA: Diagnosis not present

## 2019-05-24 DIAGNOSIS — Z03818 Encounter for observation for suspected exposure to other biological agents ruled out: Secondary | ICD-10-CM | POA: Diagnosis not present

## 2020-01-23 DIAGNOSIS — Z23 Encounter for immunization: Secondary | ICD-10-CM | POA: Diagnosis not present

## 2020-01-23 DIAGNOSIS — Z0000 Encounter for general adult medical examination without abnormal findings: Secondary | ICD-10-CM | POA: Diagnosis not present

## 2020-01-29 ENCOUNTER — Other Ambulatory Visit: Payer: Self-pay

## 2020-01-29 DIAGNOSIS — Z113 Encounter for screening for infections with a predominantly sexual mode of transmission: Secondary | ICD-10-CM

## 2020-01-29 DIAGNOSIS — B20 Human immunodeficiency virus [HIV] disease: Secondary | ICD-10-CM

## 2020-01-29 DIAGNOSIS — Z79899 Other long term (current) drug therapy: Secondary | ICD-10-CM

## 2020-02-03 ENCOUNTER — Ambulatory Visit: Payer: BC Managed Care – PPO

## 2020-02-03 ENCOUNTER — Other Ambulatory Visit: Payer: BC Managed Care – PPO

## 2020-02-25 ENCOUNTER — Encounter: Payer: BC Managed Care – PPO | Admitting: Infectious Diseases

## 2020-02-25 ENCOUNTER — Ambulatory Visit: Payer: BC Managed Care – PPO | Admitting: Pharmacist

## 2020-03-04 ENCOUNTER — Telehealth: Payer: Self-pay

## 2020-03-04 NOTE — Telephone Encounter (Signed)
[facility] Patient Product/process development scientist completed.    The patient is insured through [employer] and has a $40.00 copay. Patient will need a Co-Pay card.  We will continue to follow to see if copay assistance is needed.  Clearance Coots, CPhT Specialty Pharmacy Patient [facility] Phone: [phone] Fax:  [phone]

## 2020-03-05 ENCOUNTER — Ambulatory Visit: Payer: BC Managed Care – PPO

## 2020-03-05 ENCOUNTER — Other Ambulatory Visit: Payer: BC Managed Care – PPO

## 2020-03-25 ENCOUNTER — Ambulatory Visit: Payer: BC Managed Care – PPO | Admitting: Pharmacist

## 2020-03-25 ENCOUNTER — Encounter: Payer: BC Managed Care – PPO | Admitting: Internal Medicine

## 2020-04-15 DIAGNOSIS — U071 COVID-19: Secondary | ICD-10-CM | POA: Diagnosis not present

## 2020-04-15 DIAGNOSIS — Z20822 Contact with and (suspected) exposure to covid-19: Secondary | ICD-10-CM | POA: Diagnosis not present

## 2020-04-17 DIAGNOSIS — U071 COVID-19: Secondary | ICD-10-CM | POA: Diagnosis not present

## 2020-04-17 DIAGNOSIS — J4540 Moderate persistent asthma, uncomplicated: Secondary | ICD-10-CM | POA: Diagnosis not present

## 2020-04-21 DIAGNOSIS — Z03818 Encounter for observation for suspected exposure to other biological agents ruled out: Secondary | ICD-10-CM | POA: Diagnosis not present

## 2020-04-21 DIAGNOSIS — Z20822 Contact with and (suspected) exposure to covid-19: Secondary | ICD-10-CM | POA: Diagnosis not present

## 2020-04-29 ENCOUNTER — Ambulatory Visit: Payer: BC Managed Care – PPO

## 2020-04-29 ENCOUNTER — Other Ambulatory Visit (HOSPITAL_COMMUNITY)
Admission: RE | Admit: 2020-04-29 | Discharge: 2020-04-29 | Disposition: A | Discharge status: Home / Self Care | Payer: BC Managed Care – PPO | Source: Ambulatory Visit | Attending: Infectious Diseases | Admitting: Infectious Diseases

## 2020-04-29 ENCOUNTER — Other Ambulatory Visit: Payer: BC Managed Care – PPO

## 2020-04-29 ENCOUNTER — Other Ambulatory Visit: Payer: Self-pay

## 2020-04-29 DIAGNOSIS — Z113 Encounter for screening for infections with a predominantly sexual mode of transmission: Secondary | ICD-10-CM | POA: Insufficient documentation

## 2020-04-29 DIAGNOSIS — B20 Human immunodeficiency virus [HIV] disease: Secondary | ICD-10-CM | POA: Diagnosis not present

## 2020-04-29 DIAGNOSIS — Z79899 Other long term (current) drug therapy: Secondary | ICD-10-CM | POA: Diagnosis not present

## 2020-04-29 LAB — URINALYSIS
Bilirubin Urine: NEGATIVE
Glucose, UA: NEGATIVE
Hgb urine dipstick: NEGATIVE
Ketones, ur: NEGATIVE
Leukocytes,Ua: NEGATIVE
Nitrite: NEGATIVE
Specific Gravity, Urine: 1.021 (ref 1.001–1.03)
pH: 7.5 (ref 5.0–8.0)

## 2020-04-30 LAB — URINE CYTOLOGY ANCILLARY ONLY
Chlamydia: NEGATIVE
Comment: NEGATIVE
Comment: NORMAL
Neisseria Gonorrhea: NEGATIVE

## 2020-04-30 LAB — T-HELPER CELL (CD4) - (RCID CLINIC ONLY)
CD4 % Helper T Cell: 15 % — ABNORMAL LOW (ref 33–65)
CD4 T Cell Abs: 161 /uL — ABNORMAL LOW (ref 400–1790)

## 2020-05-04 DIAGNOSIS — Z13220 Encounter for screening for lipoid disorders: Secondary | ICD-10-CM | POA: Diagnosis not present

## 2020-05-04 DIAGNOSIS — B20 Human immunodeficiency virus [HIV] disease: Secondary | ICD-10-CM | POA: Diagnosis not present

## 2020-05-09 LAB — CBC WITH DIFFERENTIAL/PLATELET
Absolute Monocytes: 454 cells/uL (ref 200–950)
Basophils Absolute: 20 cells/uL (ref 0–200)
Basophils Relative: 0.4 %
Eosinophils Absolute: 31 cells/uL (ref 15–500)
Eosinophils Relative: 0.6 %
HCT: 41.1 % (ref 38.5–50.0)
Hemoglobin: 13.9 g/dL (ref 13.2–17.1)
Lymphs Abs: 1214 cells/uL (ref 850–3900)
MCH: 28.4 pg (ref 27.0–33.0)
MCHC: 33.8 g/dL (ref 32.0–36.0)
MCV: 83.9 fL (ref 80.0–100.0)
MPV: 9.4 fL (ref 7.5–12.5)
Monocytes Relative: 8.9 %
Neutro Abs: 3381 cells/uL (ref 1500–7800)
Neutrophils Relative %: 66.3 %
Platelets: 325 10*3/uL (ref 140–400)
RBC: 4.9 10*6/uL (ref 4.20–5.80)
RDW: 13.6 % (ref 11.0–15.0)
Total Lymphocyte: 23.8 %
WBC: 5.1 10*3/uL (ref 3.8–10.8)

## 2020-05-09 LAB — QUANTIFERON-TB GOLD PLUS
Mitogen-NIL: >10 IU/mL
NIL: 0.03 IU/mL
QuantiFERON-TB Gold Plus: NEGATIVE
TB1-NIL: 0 IU/mL
TB2-NIL: 0 IU/mL

## 2020-05-09 LAB — COMPLETE METABOLIC PANEL WITH GFR
AG Ratio: 1.4 (calc) (ref 1.0–2.5)
ALT: 38 U/L (ref 9–46)
AST: 18 U/L (ref 10–40)
Albumin: 4.2 g/dL (ref 3.6–5.1)
Alkaline phosphatase (APISO): 78 U/L (ref 36–130)
BUN: 9 mg/dL (ref 7–25)
CO2: 24 mmol/L (ref 20–32)
Calcium: 10.4 mg/dL — ABNORMAL HIGH (ref 8.6–10.3)
Chloride: 104 mmol/L (ref 98–110)
Creat: 0.66 mg/dL (ref 0.60–1.35)
GFR, Est African American: 145 mL/min/{1.73_m2} (ref >=60)
GFR, Est Non African American: 125 mL/min/{1.73_m2} (ref >=60)
Globulin: 3 g/dL (calc) (ref 1.9–3.7)
Glucose, Bld: 101 mg/dL — ABNORMAL HIGH (ref 65–99)
Potassium: 3.7 mmol/L (ref 3.5–5.3)
Sodium: 137 mmol/L (ref 135–146)
Total Bilirubin: 0.2 mg/dL (ref 0.2–1.2)
Total Protein: 7.2 g/dL (ref 6.1–8.1)

## 2020-05-09 LAB — HEPATITIS A ANTIBODY, TOTAL: Hepatitis A AB,Total: REACTIVE — AB

## 2020-05-09 LAB — RPR: RPR Ser Ql: NONREACTIVE

## 2020-05-09 LAB — HIV-1/2 AB - DIFFERENTIATION
HIV-1 antibody: POSITIVE — AB
HIV-2 Ab: NEGATIVE

## 2020-05-09 LAB — HEPATITIS B SURFACE ANTIGEN: Hepatitis B Surface Ag: NONREACTIVE

## 2020-05-09 LAB — LIPID PANEL
Cholesterol: 184 mg/dL (ref <200)
HDL: 42 mg/dL (ref >=40)
LDL Cholesterol (Calc): 125 mg/dL (calc) — ABNORMAL HIGH
Non-HDL Cholesterol (Calc): 142 mg/dL (calc) — ABNORMAL HIGH (ref <130)
Total CHOL/HDL Ratio: 4.4 (calc) (ref <5.0)
Triglycerides: 71 mg/dL (ref <150)

## 2020-05-09 LAB — HIV-1 GENOTYPE: HIV-1 Genotype: DETECTED — AB

## 2020-05-09 LAB — HLA B*5701: HLA-B*5701 w/rflx HLA-B High: NEGATIVE

## 2020-05-09 LAB — HEPATITIS B SURFACE ANTIBODY,QUALITATIVE: Hep B S Ab: NONREACTIVE

## 2020-05-09 LAB — HIV-1 RNA ULTRAQUANT REFLEX TO GENTYP+
HIV 1 RNA Quant: 5390 copies/mL — ABNORMAL HIGH
HIV-1 RNA Quant, Log: 3.73 Log copies/mL — ABNORMAL HIGH

## 2020-05-09 LAB — HEPATITIS B CORE ANTIBODY, TOTAL: Hep B Core Total Ab: NONREACTIVE

## 2020-05-09 LAB — HIV ANTIBODY (ROUTINE TESTING W REFLEX): HIV 1&2 Ab, 4th Generation: REACTIVE — AB

## 2020-05-09 LAB — HEPATITIS C ANTIBODY
Hepatitis C Ab: NONREACTIVE
SIGNAL TO CUT-OFF: 0.04 (ref <1.00)

## 2020-05-14 ENCOUNTER — Other Ambulatory Visit: Payer: Self-pay

## 2020-05-14 ENCOUNTER — Telehealth: Payer: Self-pay

## 2020-05-14 ENCOUNTER — Ambulatory Visit (INDEPENDENT_AMBULATORY_CARE_PROVIDER_SITE_OTHER): Payer: BC Managed Care – PPO | Admitting: Infectious Diseases

## 2020-05-14 ENCOUNTER — Encounter: Payer: Self-pay | Admitting: Infectious Diseases

## 2020-05-14 VITALS — BP 115/78 | HR 85 | Temp 98.2°F | Wt 181.0 lb

## 2020-05-14 DIAGNOSIS — K900 Celiac disease: Secondary | ICD-10-CM | POA: Diagnosis not present

## 2020-05-14 DIAGNOSIS — B20 Human immunodeficiency virus [HIV] disease: Secondary | ICD-10-CM | POA: Diagnosis not present

## 2020-05-14 DIAGNOSIS — Z23 Encounter for immunization: Secondary | ICD-10-CM

## 2020-05-14 DIAGNOSIS — J4541 Moderate persistent asthma with (acute) exacerbation: Secondary | ICD-10-CM

## 2020-05-14 MED ORDER — BICTEGRAVIR-EMTRICITAB-TENOFOV 50-200-25 MG PO TABS: 1.0000 | ORAL_TABLET | Freq: Every day | ORAL | 5 refills | Status: DC

## 2020-05-14 NOTE — Progress Notes (Signed)
Name: Gordon Turner  DOB: June 13, 1985 MRN: [id number] PCP: Pcp, No     Brief Narrative:  Gordon Turner is a 35 y.o. male with well controlled HIV, Dx 2012. CD4 nadir  HIV Risk: MSM  History of OIs: none known  Intake Labs 04/2020: Hep B sAg (-), sAb (-), cAb (-); Hep A (+), Hep C (-) Quantiferon (-) HLA B*5701 (-) G6PD: ()   Previous Regimens: . Reyataz/norvir + Truvada >> constant N/V and weight loss . Stribild 2013 - 2016 . Biktarvy - 2018 after 1 year lapse   Genotypes: . 04-2020 - wildtype  . 08-2017 - wildtype  Subjective:   Chief Complaint  Patient presents with  . New Patient (Initial Visit)     HPI: Gordon Turner is a 35 y.o. male here to establish care for HIV disease. Previously was in care in [place] but has been living here locally in [place] for a few years now due to employment. Works remotely from home but has previously been traveling back and forth from [place] and [place].   Eating and sleeping well. Not currently on any mediations for anxiety. Working with PCP at [facility]. In a monogamous relationship with his boyfriend. Does not smoke or use tobacco products. No drug use or problems with excessive alcohol.   Last office visit in [facility] was 10-2017 with VL 63 copies and CD4 335 (17%) at that visit. He had been receiving refills of Biktarvy up until about 2-3 months ago when they required a follow up office visit vs transfer care officially to local ID team.   Recently had [place] in mid-January. Mildly symptomatic with mostly URI symptoms that have all resolved. Vaccinated x 3  Depression screen [facility] 2/9 05/14/2020  Decreased Interest 0  Down, Depressed, Hopeless 0  PHQ - 2 Score 0    Review of Systems  Constitutional: Negative for chills, fever, malaise/fatigue and weight loss.  HENT: Negative for sore throat.   Respiratory: Negative for cough, sputum production and shortness of breath.   Cardiovascular: Negative.   Gastrointestinal: Negative for  abdominal pain, diarrhea and vomiting.  Musculoskeletal: Negative for joint pain, myalgias and neck pain.  Skin: Negative for rash.  Neurological: Negative for headaches.  Psychiatric/Behavioral: Negative for depression and substance abuse. The patient is nervous/anxious.       Past Medical History:  Diagnosis Date  . Asthma   . HIV (human immunodeficiency virus infection) ([place])     Outpatient Medications Prior to Visit  Medication Sig Dispense Refill  . albuterol (VENTOLIN HFA) 108 (90 Base) MCG/ACT inhaler Inhale 1-2 puffs into the lungs every 6 (six) hours as needed for wheezing or shortness of breath. 18 g 1  . diphenhydrAMINE (BENADRYL) 25 MG tablet 1 to 2 tablets every 6 hours for itching and swelling 20 tablet 0  . DULERA 100-5 MCG/ACT AERO     . famotidine (PEPCID) 20 MG tablet 1 tablet twice daily for 3 to 4 days. 30 tablet 0  . Ipratropium-Albuterol (COMBIVENT RESPIMAT) 20-100 MCG/ACT AERS respimat Inhale 1 puff into the lungs every 6 (six) hours. 4 g 0  . ondansetron (ZOFRAN) 4 MG tablet Take 1 tablet (4 mg total) by mouth every 6 (six) hours. 12 tablet 0  . bictegravir-emtricitabine-tenofovir AF (BIKTARVY) 50-200-25 MG TABS tablet Take 1 tablet by mouth daily.    . budesonide-formoterol (SYMBICORT) 80-4.5 MCG/ACT inhaler Take 2 puffs by mouth 2 (two) times daily. (Patient not taking: Reported on 05/14/2020)    . ipratropium (ATROVENT) 0.02 %  nebulizer solution Take by nebulization.    . predniSONE (DELTASONE) 20 MG tablet Take 2 tablets (40 mg total) by mouth daily. (Patient not taking: Reported on 05/14/2020) 10 tablet 0   No facility-administered medications prior to visit.     Allergies  Allergen Reactions  . Amoxicillin Anaphylaxis  . Penicillins Anaphylaxis, Hives, Swelling and Other (See Comments)    DID THE REACTION INVOLVE: Swelling of the face/tongue/throat, SOB, or low BP? No Sudden or severe rash/hives, skin peeling, or the inside of the mouth or nose? No Did  it require medical treatment? No When did it last happen?baby If all above answers are "NO", may proceed with cephalosporin use.    Social History   Tobacco Use  . Smoking status: Never Smoker  . Smokeless tobacco: Never Used  Vaping Use  . Vaping Use: Never used  Substance Use Topics  . Alcohol use: Yes    Comment: socially  . Drug use: Not Currently    Types: Marijuana    No family history on file.  Social History   Substance and Sexual Activity  Sexual Activity Not Currently  . Partners: Male   Comment: given condoms 04/2020     Objective:   Vitals:   05/14/20 1036  BP: 115/78  Pulse: 85  Temp: 98.2 F (36.8 C)  TempSrc: Oral  Weight: 181 lb (82.1 kg)   Body mass index is 27.52 kg/m.  Physical Exam Vitals reviewed.  Constitutional:      Appearance: Normal appearance. He is not ill-appearing.  HENT:     Head: Normocephalic.     Mouth/Throat:     Mouth: Mucous membranes are moist.     Pharynx: Oropharynx is clear.  Eyes:     General: No scleral icterus. Pulmonary:     Effort: Pulmonary effort is normal.  Musculoskeletal:        General: Normal range of motion.     Cervical back: Normal range of motion.  Skin:    Coloration: Skin is not jaundiced or pale.  Neurological:     Mental Status: He is alert and oriented to person, place, and time.  Psychiatric:        Mood and Affect: Mood normal.        Judgment: Judgment normal.     Lab Results Lab Results  Component Value Date   WBC 5.1 04/29/2020   HGB 13.9 04/29/2020   HCT 41.1 04/29/2020   MCV 83.9 04/29/2020   PLT 325 04/29/2020    Lab Results  Component Value Date   CREATININE 0.66 04/29/2020   BUN 9 04/29/2020   NA 137 04/29/2020   K 3.7 04/29/2020   CL 104 04/29/2020   CO2 24 04/29/2020    Lab Results  Component Value Date   ALT 38 04/29/2020   AST 18 04/29/2020   ALKPHOS 67 03/31/2018   BILITOT 0.2 04/29/2020    Lab Results  Component Value Date   CHOL 184  04/29/2020   HDL 42 04/29/2020   LDLCALC 125 (H) 04/29/2020   TRIG 71 04/29/2020   CHOLHDL 4.4 04/29/2020   HIV 1 RNA Quant (copies/mL)  Date Value  04/29/2020 5,390 (H)   CD4 T Cell Abs (/uL)  Date Value  04/29/2020 161 (L)     Assessment & Plan:   Problem List Items Addressed This Visit      Unprioritized   Moderate persistent asthma with exacerbation    On maintenance/PRN inhalers with PCP  Relevant Medications   ipratropium (ATROVENT) 0.02 % nebulizer solution   DULERA 100-5 MCG/ACT AERO   Human immunodeficiency virus (HIV) disease ([place])    New patient here to establish for HIV care.  Diagnosed in 2012 with a few lapses in treatment and medication changes for intolerances and safety over the years. He was doing well with Biktarvy one daily and is asking about Cabenuva injections potentilally for him. I described the transiiton process and explained this would need to be a "stable switch" meaning he needs to get back on his Biktarvy for 3-6 months prior to switching. Also in my experience thusfar that [place] has not been approving it yet as it is not on formulary.    CD4 count was lower than expected for proportion of % - he had just had COVID infection shortly before his lab draw and I suspect it was more c/w impact of that acute process. Will defer bactrim prophylaxis and follow up CD4 in 3 months.   Will help with co-pay assistance today.  General introduction to our clinic and integrated services. Has access to Dental through private means. No acute needs today.   Will give him his first HPV today with return in 2 months for #2 as well as repeat viral load and CD4.  Pneumovax booster today.  I spent greater than 45 minutes with the patient today. Greater than 50% of the time spent face-to-face counseling and coordination of care re: HIV and health maintenance.        Relevant Medications   bictegravir-emtricitabine-tenofovir AF (BIKTARVY) 50-200-25 MG TABS tablet    Celiac disease    Previously followed by GI in [place]. Gluten free diet.        Other Visit Diagnoses    Currently asymptomatic HIV infection, with history of HIV-related illness ([place])    -  Primary   Relevant Medications   bictegravir-emtricitabine-tenofovir AF (BIKTARVY) 50-200-25 MG TABS tablet   Other Relevant Orders   HIV-1 RNA quant-no reflex-bld   T-helper cell (CD4)- ([facility] clinic only)   Need for pneumococcal vaccination       Relevant Orders   Pneumococcal polysaccharide vaccine 23-valent greater than or equal to 2yo subcutaneous/IM (Completed)   Need for HPV vaccination       Relevant Orders   HPV 9-valent vaccine,Recombinat (Completed)      [name], MSN, NP-C [facility] for [place] Pager: [phone] Office: [phone]  05/14/20  10:48 PM

## 2020-05-14 NOTE — Assessment & Plan Note (Signed)
On maintenance/PRN inhalers with PCP

## 2020-05-14 NOTE — Patient Instructions (Addendum)
Will get you a copay card for the Biktarvy.   Please come back in 8 weeks --> we can repeat your labs this visit and give you your next HPV shot.    CABENUVA is the new medication to treat HIV. It is an injection in each butt cheek every 30 days at this time.   We would start with a pill "lead in" period for you where you take 2 pills once a day with food for 28 days to make sure you tolerate them well before we do the long acting injection.   Largely the side effects were of course related to the injection itself, but during the studies where this was used to treat HIV patients there were only a very small (< 5) number of patients that stopped it due to pain from the shots. Most of the patients in the trials reported overall improvement in the pain associated with the shots over time.   Patient Information Link: https://gskpro.com/content/dam/global/hcpportal/en_US/Prescribing_Information/Cabenuva/pdf/CABENUVA-PI-PIL-[email or web address]-IFU3.PDF#page=36   [employer] is not currently [employer] now but that is likely to change in a few months. We can look into this for you now to be sure but likely we will need to try in 4-6 months.   Would like to see you back in 4 months to check in.   Your immune system came back a little low, but I suspect it was due to COVID infection recently. We will repeat this again in 2 months.

## 2020-05-14 NOTE — Telephone Encounter (Signed)
[facility] Patient Advocate Encounter   Was successful in obtaining a [place] copay card for [employer].  This copay card will make the patients copay 0.00.  I have spoken with the patient.    The billing information is as follows and has been shared with [facility] [address].           Clearance Coots, CPhT Specialty Pharmacy Patient [facility] for Infectious Disease Phone: [phone] Fax:  [phone]

## 2020-05-14 NOTE — Assessment & Plan Note (Signed)
New patient here to establish for HIV care.  Diagnosed in 2012 with a few lapses in treatment and medication changes for intolerances and safety over the years. He was doing well with Biktarvy one daily and is asking about Cabenuva injections potentilally for him. I described the transiiton process and explained this would need to be a "stable switch" meaning he needs to get back on his Biktarvy for 3-6 months prior to switching. Also in my experience thusfar that [employer] has not been approving it yet as it is not on formulary.    CD4 count was lower than expected for proportion of % - he had just had COVID infection shortly before his lab draw and I suspect it was more c/w impact of that acute process. Will defer bactrim prophylaxis and follow up CD4 in 3 months.   Will help with co-pay assistance today.  General introduction to our clinic and integrated services. Has access to Dental through private means. No acute needs today.   Will give him his first HPV today with return in 2 months for #2 as well as repeat viral load and CD4.  Pneumovax booster today.  I spent greater than 45 minutes with the patient today. Greater than 50% of the time spent face-to-face counseling and coordination of care re: HIV and health maintenance.

## 2020-05-14 NOTE — Assessment & Plan Note (Signed)
Previously followed by GI in [place]. Gluten free diet.

## 2020-07-13 ENCOUNTER — Other Ambulatory Visit: Payer: BC Managed Care – PPO

## 2020-09-04 ENCOUNTER — Ambulatory Visit: Payer: BC Managed Care – PPO | Admitting: Infectious Diseases

## 2021-01-18 ENCOUNTER — Telehealth: Payer: Self-pay

## 2021-01-18 NOTE — Telephone Encounter (Signed)
Called patient to schedule overdue appointment, no answer and voicemail box not set up.   Sandie Ano, RN

## 2021-09-22 ENCOUNTER — Encounter: Payer: Self-pay | Admitting: Infectious Diseases

## 2021-09-22 ENCOUNTER — Other Ambulatory Visit: Payer: Self-pay

## 2021-09-22 ENCOUNTER — Ambulatory Visit (INDEPENDENT_AMBULATORY_CARE_PROVIDER_SITE_OTHER): Payer: BC Managed Care – PPO | Admitting: Infectious Diseases

## 2021-09-22 VITALS — BP 120/84 | HR 80 | Temp 97.9°F | Wt 173.0 lb

## 2021-09-22 DIAGNOSIS — Z113 Encounter for screening for infections with a predominantly sexual mode of transmission: Secondary | ICD-10-CM | POA: Diagnosis not present

## 2021-09-22 DIAGNOSIS — Z23 Encounter for immunization: Secondary | ICD-10-CM | POA: Diagnosis not present

## 2021-09-22 DIAGNOSIS — Z0000 Encounter for general adult medical examination without abnormal findings: Secondary | ICD-10-CM | POA: Diagnosis not present

## 2021-09-22 DIAGNOSIS — B20 Human immunodeficiency virus [HIV] disease: Secondary | ICD-10-CM

## 2021-09-22 MED ORDER — BICTEGRAVIR-EMTRICITAB-TENOFOV 50-200-25 MG PO TABS: 1.0000 | ORAL_TABLET | Freq: Every day | ORAL | 5 refills | Status: DC

## 2021-09-22 NOTE — Progress Notes (Signed)
Name: Gordon Turner  DOB: 05/05/1985 MRN: [id number] PCP: Pcp, No    Brief Narrative:  Gordon Turner is a 36 y.o. male with well controlled HIV, Dx 2012. CD4 nadir  HIV Risk: MSM  History of OIs: none known  Intake Labs 04/2020: Hep B sAg (-), sAb (-), cAb (-); Hep A (+), Hep C (-) Quantiferon (-) HLA B*5701 (-) G6PD: ()   Previous Regimens: Reyataz/norvir + Truvada >> constant N/V and weight loss Stribild 2013 - 2016 Biktarvy - 2018    Genotypes: 04-2020 - wildtype  08-2017 - wildtype  Subjective:   Chief Complaint  Patient presents with   Follow-up     HPI: Here for HIV follow up care.  Has been off biktarvy for about 7 months due to chaotic work schedule. He is fortunately local now and is no longer having to travel out of state. He feels this is a good time to consider Cabenuva injections for treatment. He can absolutely come every 2 months for his 2 injections. He feels that he will be more successful with removing the daily pill burden piece.   No updates to health since last OV in Feb 2022. No hospitalizations/ER visits. No new medical conditions. No trouble with sleep or anxious mood.       09/22/2021    3:50 PM  Depression screen PHQ 2/9  Decreased Interest 0  Down, Depressed, Hopeless 0  PHQ - 2 Score 0    Review of Systems  Constitutional:  Negative for chills, fever, malaise/fatigue and weight loss.  HENT:  Negative for sore throat.   Respiratory:  Negative for cough, sputum production and shortness of breath.   Cardiovascular: Negative.   Gastrointestinal:  Negative for abdominal pain, diarrhea and vomiting.  Musculoskeletal:  Negative for joint pain, myalgias and neck pain.  Skin:  Negative for rash.  Neurological:  Negative for headaches.  Psychiatric/Behavioral:  Negative for depression and substance abuse. The patient is not nervous/anxious.       Past Medical History:  Diagnosis Date   Asthma    HIV (human immunodeficiency virus  infection) ([place])     Outpatient Medications Prior to Visit  Medication Sig Dispense Refill   albuterol (VENTOLIN HFA) 108 (90 Base) MCG/ACT inhaler Inhale 1-2 puffs into the lungs every 6 (six) hours as needed for wheezing or shortness of breath. 18 g 1   diphenhydrAMINE (BENADRYL) 25 MG tablet 1 to 2 tablets every 6 hours for itching and swelling 20 tablet 0   DULERA 100-5 MCG/ACT AERO      famotidine (PEPCID) 20 MG tablet 1 tablet twice daily for 3 to 4 days. 30 tablet 0   ipratropium (ATROVENT) 0.02 % nebulizer solution Take by nebulization.     Ipratropium-Albuterol (COMBIVENT RESPIMAT) 20-100 MCG/ACT AERS respimat Inhale 1 puff into the lungs every 6 (six) hours. 4 g 0   ondansetron (ZOFRAN) 4 MG tablet Take 1 tablet (4 mg total) by mouth every 6 (six) hours. 12 tablet 0   bictegravir-emtricitabine-tenofovir AF (BIKTARVY) 50-200-25 MG TABS tablet Take 1 tablet by mouth daily. Try to take at the same time each day with or without food. 30 tablet 5   budesonide-formoterol (SYMBICORT) 80-4.5 MCG/ACT inhaler Take 2 puffs by mouth 2 (two) times daily. (Patient not taking: Reported on 05/14/2020)     No facility-administered medications prior to visit.     Allergies  Allergen Reactions   Amoxicillin Anaphylaxis   Penicillins Anaphylaxis, Hives, Swelling and Other (See Comments)  DID THE REACTION INVOLVE: Swelling of the face/tongue/throat, SOB, or low BP? No Sudden or severe rash/hives, skin peeling, or the inside of the mouth or nose? No Did it require medical treatment? No When did it last happen?     baby If all above answers are "NO", may proceed with cephalosporin use.    Social History   Tobacco Use   Smoking status: Never   Smokeless tobacco: Never  Vaping Use   Vaping Use: Never used  Substance Use Topics   Alcohol use: Yes    Comment: socially   Drug use: Not Currently    Types: Marijuana    Social History   Substance and Sexual Activity  Sexual Activity Not  Currently   Partners: Male   Comment: given condoms   Social History   Substance and Sexual Activity  Sexual Activity Not Currently   Partners: Male   Comment: given condoms     Upstream - 09/22/21 1553       Pregnancy Intention Screening   Does the patient want to become pregnant in the next year? N/A    Does the patient's partner want to become pregnant in the next year? N/A    Would the patient like to discuss contraceptive options today? No      Contraception Wrap Up   Current Method Male Condom              Objective:   Vitals:   09/22/21 1549  BP: 120/84  Pulse: 80  Temp: 97.9 F (36.6 C)  TempSrc: Temporal  Weight: 173 lb (78.5 kg)   Body mass index is 26.3 kg/m.  Physical Exam Constitutional:      Appearance: Normal appearance. He is not ill-appearing.  HENT:     Head: Normocephalic.     Mouth/Throat:     Mouth: Mucous membranes are moist.     Pharynx: Oropharynx is clear.  Eyes:     General: No scleral icterus. Cardiovascular:     Rate and Rhythm: Normal rate.  Pulmonary:     Effort: Pulmonary effort is normal.  Musculoskeletal:        General: Normal range of motion.     Cervical back: Normal range of motion.  Skin:    Coloration: Skin is not jaundiced or pale.  Neurological:     Mental Status: He is alert and oriented to person, place, and time.  Psychiatric:        Mood and Affect: Mood normal.        Judgment: Judgment normal.     Lab Results Lab Results  Component Value Date   WBC 5.1 04/29/2020   HGB 13.9 04/29/2020   HCT 41.1 04/29/2020   MCV 83.9 04/29/2020   PLT 325 04/29/2020    Lab Results  Component Value Date   CREATININE 0.66 04/29/2020   BUN 9 04/29/2020   NA 137 04/29/2020   K 3.7 04/29/2020   CL 104 04/29/2020   CO2 24 04/29/2020    Lab Results  Component Value Date   ALT 38 04/29/2020   AST 18 04/29/2020   ALKPHOS 67 03/31/2018   BILITOT 0.2 04/29/2020    Lab Results  Component Value Date    CHOL 184 04/29/2020   HDL 42 04/29/2020   LDLCALC 125 (H) 04/29/2020   TRIG 71 04/29/2020   CHOLHDL 4.4 04/29/2020   HIV 1 RNA Quant (copies/mL)  Date Value  04/29/2020 5,390 (H)   CD4 T Cell Abs (/uL)  Date Value  04/29/2020 161 (L)     Assessment & Plan:   Problem List Items Addressed This Visit       Unprioritized   Healthcare maintenance    Menveo booster administered today. Prevnar due in 2027.  Recommend annual flu vaccine in fall (October).  Guidelines expected to come out regarding annual anal cancer screening for PLWH. Will discuss once released and assess screening risk.  We reviewed risk factors for monkeypox and both determined he is not at high risk for this condition and will defer vaccination at this time.       Human immunodeficiency virus (HIV) disease ([place])    Uncontrolled currently off medication.  Bowdy has been off [place] for the last 7 months or so due to chaotic work schedule. Things have settled down significantly for him and he is staying local now for the foreseeable future.  He is very interested in [place] transition. Will resume Biktarvy for now and have him back in 2 months to re-assess VL, review genotype and inquiry insurance.  No insurance changes.  No dental needs today.  No concern over anxious/depressed mood.  Sexual health discussed including screening needs. Politely declined any screening needs.  Vaccines updated today - see health maintenance section.   Return in about 2 months (around 11/29/2021).        Relevant Medications   bictegravir-emtricitabine-tenofovir AF (BIKTARVY) 50-200-25 MG TABS tablet   Other Visit Diagnoses     Currently asymptomatic HIV infection, with history of HIV-related illness ([place])    -  Primary   Relevant Medications   bictegravir-emtricitabine-tenofovir AF (BIKTARVY) 50-200-25 MG TABS tablet   Other Relevant Orders   CBC   COMPLETE METABOLIC PANEL WITH GFR   RPR   Lipid panel   T-helper cells  (CD4) count (not at [facility])   HIV RNA, RTPCR W/R GT (RTI, PI,INT)      [name], MSN, NP-C [facility] for Infectious [place] Pager: [phone] Office: [phone]  09/22/21  4:10 PM

## 2021-09-22 NOTE — Addendum Note (Signed)
Addended by: Philippa Chester on: 09/22/2021 04:13 PM   Modules accepted: Orders

## 2021-09-22 NOTE — Patient Instructions (Addendum)
We gave you your meningitis vaccine today - next will be due in June 2028 Pneumonia booster due in 2027  Will get you back on Biktarvy for now and have you back early September to review for Cabenuva.  Need to be undetectable to switch and make sure you can come every 2 months to the office for injections. Very important not to delay the shots because it can lead to them not working for you.  We will also need to check with your insurance company to make sure it is accessible for you.   See you in September! Stop by the lab on your way out please

## 2021-09-22 NOTE — Assessment & Plan Note (Signed)
Menveo booster administered today. Prevnar due in 2027.  Recommend annual flu vaccine in fall (October).  Guidelines expected to come out regarding annual anal cancer screening for PLWH. Will discuss once released and assess screening risk.  We reviewed risk factors for monkeypox and both determined he is not at high risk for this condition and will defer vaccination at this time.

## 2021-09-22 NOTE — Assessment & Plan Note (Addendum)
Uncontrolled currently off medication.  Gordon Turner has been off Biktarvy for the last 7 months or so due to chaotic work schedule. Things have settled down significantly for him and he is staying local now for the foreseeable future.  He is very interested in [place] transition. Will resume Biktarvy for now and have him back in 2 months to re-assess VL, review genotype and inquiry insurance.  No insurance changes.  No dental needs today.  No concern over anxious/depressed mood.  Sexual health discussed including screening needs. Politely declined any screening needs.  Vaccines updated today - see health maintenance section.   Return in about 2 months (around 11/29/2021).

## 2021-09-23 LAB — T-HELPER CELLS (CD4) COUNT (NOT AT ARMC)
CD4 % Helper T Cell: 20 % — ABNORMAL LOW (ref 33–65)
CD4 T Cell Abs: 290 /uL — ABNORMAL LOW (ref 400–1790)

## 2021-09-29 ENCOUNTER — Telehealth: Payer: Self-pay

## 2021-09-29 ENCOUNTER — Other Ambulatory Visit (HOSPITAL_COMMUNITY): Payer: Self-pay

## 2021-09-29 NOTE — Telephone Encounter (Signed)
[facility] Patient Advocate Encounter   Received notification from [employer] that prior authorization for Gordon Turner is required.   PA submitted on 09/29/21 Key [id number] Status is pending    [facility] will continue to follow.   Clearance Coots, CPhT Specialty Pharmacy Patient [facility] for Infectious Disease Phone: [phone] Fax:  [phone]

## 2021-09-29 NOTE — Telephone Encounter (Signed)
Gordon Turner is not covered under his plan (product not on formulary)Symtuza and Genvoya is not on the formulary as well only Triumeq and Dovato is on the plan for no charge. If you can see if [name] would like to switch him or if he can be switch to Dovato or Triumeq

## 2021-09-30 ENCOUNTER — Other Ambulatory Visit: Payer: Self-pay

## 2021-09-30 ENCOUNTER — Other Ambulatory Visit (HOSPITAL_COMMUNITY): Payer: Self-pay

## 2021-09-30 ENCOUNTER — Encounter: Payer: Self-pay | Admitting: Infectious Diseases

## 2021-09-30 ENCOUNTER — Ambulatory Visit (INDEPENDENT_AMBULATORY_CARE_PROVIDER_SITE_OTHER): Payer: BC Managed Care – PPO | Admitting: Infectious Diseases

## 2021-09-30 VITALS — BP 110/74 | HR 71 | Temp 98.1°F | Wt 175.0 lb

## 2021-09-30 DIAGNOSIS — R21 Rash and other nonspecific skin eruption: Secondary | ICD-10-CM

## 2021-09-30 DIAGNOSIS — I83812 Varicose veins of left lower extremities with pain: Secondary | ICD-10-CM | POA: Diagnosis not present

## 2021-09-30 DIAGNOSIS — B20 Human immunodeficiency virus [HIV] disease: Secondary | ICD-10-CM | POA: Diagnosis not present

## 2021-09-30 DIAGNOSIS — I8390 Asymptomatic varicose veins of unspecified lower extremity: Secondary | ICD-10-CM | POA: Insufficient documentation

## 2021-09-30 MED ORDER — TRIAMCINOLONE ACETONIDE 0.5 % EX OINT: 1.0000 | TOPICAL_OINTMENT | Freq: Two times a day (BID) | CUTANEOUS | 0 refills | Status: AC

## 2021-09-30 MED ORDER — BIKTARVY 50-200-25 MG PO TABS: 1.0000 | ORAL_TABLET | Freq: Every day | ORAL | 0 refills | Status: DC

## 2021-09-30 NOTE — Assessment & Plan Note (Signed)
Trouble getting insurance to approve Gordon Turner for continuity treatment. We gave him 4 weeks of samples today and will redraw his lab in 4 weeks to prove undetectable and make move to cabenuva. He was informed and consented today and would like to proceed. We probably need to get him one more bottle at the lab draw to hold him over until PA done and process arranged.

## 2021-09-30 NOTE — Telephone Encounter (Signed)
Received call from April with [facility] letting office know that appeal will be needed for [facility].   Spoke with Lupita Leash on pharmacy team, patient is coming in to pick up samples and will be discussing switch to [place]. No need to complete appeal for Biktarvy at this time.   Sandie Ano, RN

## 2021-09-30 NOTE — Assessment & Plan Note (Signed)
Flat macular/lacey hyperpigmented rash localized to left ankle. Small nodule noted that is non-tender. He has several varicose veins that I can feel underlying this skin area.  No concern over typical bacterial infection.  RPR was negative a few days ago and this does not seem typical of syphilitic skin changes.  No animal/insect exposure.  Does not seem consistent with vasculitis as it is not petechial or purpuric.   Will treat as venous stasis and follow - topical steroids, elevation and compression stockings/elevation and keep me updated should this evolve to anything further or changes to the skin.

## 2021-09-30 NOTE — Progress Notes (Signed)
Name: Gordon Turner  DOB: 1985-05-29 MRN: [id number] PCP: Pcp, No    Brief Narrative:  Braidyn Scorsone is a 36 y.o. male with well controlled HIV, Dx 2012. CD4 nadir  HIV Risk: MSM  History of OIs: none known  Intake Labs 04/2020: Hep B sAg (-), sAb (-), cAb (-); Hep A (+), Hep C (-) Quantiferon (-) HLA B*5701 (-) G6PD: ()   Previous Regimens: Reyataz/norvir + Truvada >> constant N/V and weight loss Stribild 2013 - 2016 Biktarvy - 2018    Genotypes: 04-2020 - wildtype  08-2017 - wildtype  Subjective:   Chief Complaint  Patient presents with   Follow-up    Rash on left ankle     HPI: Here for a new rash that is darker colored skin that has come up over the anterior left ankle over the last few days. It is a little sensitive to the touch but not overly painful. No injury to the skin to his awareness. Does have a dog that hits up against his legs from time to time but no penetrating injury to his knowledge.  No fevers/chills.       09/22/2021    3:50 PM  Depression screen PHQ 2/9  Decreased Interest 0  Down, Depressed, Hopeless 0  PHQ - 2 Score 0    Review of Systems  Skin:  Positive for itching (light infrequent itching over rash) and rash.      Past Medical History:  Diagnosis Date   Asthma    HIV (human immunodeficiency virus infection) ([place])     Outpatient Medications Prior to Visit  Medication Sig Dispense Refill   albuterol (VENTOLIN HFA) 108 (90 Base) MCG/ACT inhaler Inhale 1-2 puffs into the lungs every 6 (six) hours as needed for wheezing or shortness of breath. 18 g 1   budesonide-formoterol (SYMBICORT) 80-4.5 MCG/ACT inhaler Take 2 puffs by mouth 2 (two) times daily. (Patient not taking: Reported on 05/14/2020)     diphenhydrAMINE (BENADRYL) 25 MG tablet 1 to 2 tablets every 6 hours for itching and swelling 20 tablet 0   DULERA 100-5 MCG/ACT AERO      famotidine (PEPCID) 20 MG tablet 1 tablet twice daily for 3 to 4 days. 30 tablet 0   ipratropium  (ATROVENT) 0.02 % nebulizer solution Take by nebulization.     Ipratropium-Albuterol (COMBIVENT RESPIMAT) 20-100 MCG/ACT AERS respimat Inhale 1 puff into the lungs every 6 (six) hours. 4 g 0   ondansetron (ZOFRAN) 4 MG tablet Take 1 tablet (4 mg total) by mouth every 6 (six) hours. 12 tablet 0   bictegravir-emtricitabine-tenofovir AF (BIKTARVY) 50-200-25 MG TABS tablet Take 1 tablet by mouth daily. Try to take at the same time each day with or without food. 30 tablet 5   No facility-administered medications prior to visit.     Allergies  Allergen Reactions   Amoxicillin Anaphylaxis   Penicillins Anaphylaxis, Hives, Swelling and Other (See Comments)    DID THE REACTION INVOLVE: Swelling of the face/tongue/throat, SOB, or low BP? No Sudden or severe rash/hives, skin peeling, or the inside of the mouth or nose? No Did it require medical treatment? No When did it last happen?     baby If all above answers are "NO", may proceed with cephalosporin use.    Social History   Tobacco Use   Smoking status: Never   Smokeless tobacco: Never  Vaping Use   Vaping Use: Never used  Substance Use Topics   Alcohol use: Yes  Comment: socially   Drug use: Not Currently    Types: Marijuana    Social History   Substance and Sexual Activity  Sexual Activity Not Currently   Partners: Male   Comment: given condoms   Social History   Substance and Sexual Activity  Sexual Activity Not Currently   Partners: Male   Comment: given condoms        Objective:   Vitals:   09/30/21 1605  BP: 110/74  Pulse: 71  Temp: 98.1 F (36.7 C)  TempSrc: Oral  Weight: 175 lb (79.4 kg)   Body mass index is 26.61 kg/m.  Physical Exam Skin:    General: Skin is warm and dry.     Capillary Refill: Capillary refill takes less than 2 seconds.     Comments: Lacey macular rash overlying anterior left ankle as pictured. Flat in texture. Underlying varicose veins palpated with some scattered varicosities  up the same leg in calf. No significant edema in the leg. No erythema or warmth.       Lab Results Lab Results  Component Value Date   WBC 3.5 (L) 09/22/2021   HGB 15.8 09/22/2021   HCT 46.5 09/22/2021   MCV 86.3 09/22/2021   PLT 284 09/22/2021    Lab Results  Component Value Date   CREATININE 0.80 09/22/2021   BUN 14 09/22/2021   NA 138 09/22/2021   K 3.6 09/22/2021   CL 106 09/22/2021   CO2 21 09/22/2021    Lab Results  Component Value Date   ALT 24 09/22/2021   AST 25 09/22/2021   ALKPHOS 67 03/31/2018   BILITOT 0.4 09/22/2021    Lab Results  Component Value Date   CHOL 152 09/22/2021   HDL 30 (L) 09/22/2021   LDLCALC 91 09/22/2021   TRIG 224 (H) 09/22/2021   CHOLHDL 5.1 (H) 09/22/2021   HIV 1 RNA Quant (copies/mL)  Date Value  09/22/2021 10,700 (H)  04/29/2020 5,390 (H)   CD4 T Cell Abs (/uL)  Date Value  09/22/2021 290 (L)  04/29/2020 161 (L)     Assessment & Plan:   Problem List Items Addressed This Visit       Unprioritized   Human immunodeficiency virus (HIV) disease ([place])    Trouble getting insurance to approve Biktarvy for continuity treatment. We gave him 4 weeks of samples today and will redraw his lab in 4 weeks to prove undetectable and make move to cabenuva. He was informed and consented today and would like to proceed. We probably need to get him one more bottle at the lab draw to hold him over until PA done and process arranged.       Relevant Medications   bictegravir-emtricitabine-tenofovir AF (BIKTARVY) 50-200-25 MG TABS tablet   Other Relevant Orders   HIV-1 RNA quant-no reflex-bld   Varicose vein of leg    Flat macular/lacey hyperpigmented rash localized to left ankle. Small nodule noted that is non-tender. He has several varicose veins that I can feel underlying this skin area.  No concern over typical bacterial infection.  RPR was negative a few days ago and this does not seem typical of syphilitic skin changes.  No  animal/insect exposure.  Does not seem consistent with vasculitis as it is not petechial or purpuric.   Will treat as venous stasis and follow - topical steroids, elevation and compression stockings/elevation and keep me updated should this evolve to anything further or changes to the skin.  [name], MSN, NP-C [facility] for Infectious [place] Pager: [phone] Office: [phone]  09/30/21  4:41 PM

## 2021-09-30 NOTE — Patient Instructions (Signed)
Rash is interesting - most suspect that it could be some dermatitis from varicose vein (swollen vein that is underlying the skin). I can feel a few popping out on your leg.   Recommend using topical steroid ointment that I sent to pharmacy twice a day. Compression stockings on this leg all day then off at night. Can pick this up at the pharmacy also - look for one that is 15 mmHg pressure. They are supposed to be tight. Can also try to keep your feet propped up to see if that helps with the swelling.   Notify me with any pictures on mychart if any changes over the weekend. Notify me if you have any redness or pain at the leg we would need to consider bacterial infection for. At this point I am not concerned about that however.    Make a lab appt in 4 weeks - will give you one more bottle of biktarvy at this appointment while we wait for it to come back in preparation to transition to [place]. Need a viral load < 50 to make this happen for your insurance.

## 2021-10-01 NOTE — Addendum Note (Signed)
Addended by: Blanchard Kelch on: 10/01/2021 08:37 AM   Modules accepted: Orders

## 2021-10-01 NOTE — Progress Notes (Signed)
Patient sent mychart requesting consideration for KS lesion - it would be extremely unusual given his intact immune system. Will place referral for dermatology to consider skin biopsy to help with arriving at diagnosis.

## 2021-10-03 ENCOUNTER — Telehealth: Payer: BC Managed Care – PPO | Admitting: Family Medicine

## 2021-10-03 DIAGNOSIS — B20 Human immunodeficiency virus [HIV] disease: Secondary | ICD-10-CM | POA: Diagnosis not present

## 2021-10-03 DIAGNOSIS — R112 Nausea with vomiting, unspecified: Secondary | ICD-10-CM

## 2021-10-03 MED ORDER — ONDANSETRON 4 MG PO TBDP
4.0000 mg | ORAL_TABLET | Freq: Three times a day (TID) | ORAL | 0 refills | Status: DC | PRN
Start: 2021-10-03 — End: 2021-11-06

## 2021-10-03 NOTE — Progress Notes (Signed)
Virtual Visit Consent   Gordon Turner, you are scheduled for a virtual visit with a [facility] provider today. Just as with appointments in the office, your consent must be obtained to participate. Your consent will be active for this visit and any virtual visit you may have with one of our providers in the next 365 days. If you have a MyChart account, a copy of this consent can be sent to you electronically.  As this is a virtual visit, video technology does not allow for your provider to perform a traditional examination. This may limit your provider's ability to fully assess your condition. If your provider identifies any concerns that need to be evaluated in person or the need to arrange testing (such as labs, EKG, etc.), we will make arrangements to do so. Although advances in technology are sophisticated, we cannot ensure that it will always work on either your end or our end. If the connection with a video visit is poor, the visit may have to be switched to a telephone visit. With either a video or telephone visit, we are not always able to ensure that we have a secure connection.  By engaging in this virtual visit, you consent to the provision of healthcare and authorize for your insurance to be billed (if applicable) for the services provided during this visit. Depending on your insurance coverage, you may receive a charge related to this service.  I need to obtain your verbal consent now. Are you willing to proceed with your visit today? Gordon Turner has provided verbal consent on 10/03/2021 for a virtual visit (video or telephone). Gordon Curio, FNP  Date: 10/03/2021 3:21 PM  Virtual Visit via Video Note   I, Gordon Turner, connected with  Gordon Turner  ([id number], 28-Nov-1985) on 10/03/21 at  3:15 PM EDT by a video-enabled telemedicine application and verified that I am speaking with the correct person using two identifiers.  Location: Patient: Virtual Visit Location Patient: Home Provider:  Virtual Visit Location Provider: Home Office   I discussed the limitations of evaluation and management by telemedicine and the availability of in person appointments. The patient expressed understanding and agreed to proceed.    History of Present Illness: Gordon Turner is a 36 y.o. who identifies as a male who was assigned male at birth, and is being seen today for nausea since starting new HIV meds. He says he forgot to ask for zofran which he has had to take before. He denies fever, abd pain and diarrhea. Marland Kitchen  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   Healthcare maintenance 09/22/2021   SBO (small bowel obstruction) (HCC) 03/31/2018   Celiac disease 10/20/2016   Moderate persistent asthma with exacerbation 05/12/2016   Depressive disorder 12/26/2013   Allergic rhinitis due to pollen 09/25/2013   Insomnia 09/25/2013   Panic disorder without agoraphobia 09/25/2013   Chronic sinusitis 04/04/2013   Human immunodeficiency virus (HIV) disease (HCC) 09/27/2011    Allergies:  Allergies  Allergen Reactions   Amoxicillin Anaphylaxis   Penicillins Anaphylaxis, Hives, Swelling and Other (See Comments)    DID THE REACTION INVOLVE: Swelling of the face/tongue/throat, SOB, or low BP? No Sudden or severe rash/hives, skin peeling, or the inside of the mouth or nose? No Did it require medical treatment? No When did it last happen?     baby If all above answers are "NO", may proceed with cephalosporin use.  Medications:  Current Outpatient Medications:    ondansetron (ZOFRAN-ODT) 4 MG disintegrating tablet, Take 1 tablet (4 mg total) by mouth every 8 (eight) hours as needed for nausea or vomiting., Disp: 20 tablet, Rfl: 0   albuterol (VENTOLIN HFA) 108 (90 Base) MCG/ACT inhaler, Inhale 1-2 puffs into the lungs every 6 (six) hours as needed for wheezing or shortness of breath., Disp: 18 g, Rfl: 1    bictegravir-emtricitabine-tenofovir AF (BIKTARVY) 50-200-25 MG TABS tablet, Take 1 tablet by mouth daily., Disp: 28 tablet, Rfl: 0   budesonide-formoterol (SYMBICORT) 80-4.5 MCG/ACT inhaler, Take 2 puffs by mouth 2 (two) times daily. (Patient not taking: Reported on 05/14/2020), Disp: , Rfl:    diphenhydrAMINE (BENADRYL) 25 MG tablet, 1 to 2 tablets every 6 hours for itching and swelling, Disp: 20 tablet, Rfl: 0   DULERA 100-5 MCG/ACT AERO, , Disp: , Rfl:    famotidine (PEPCID) 20 MG tablet, 1 tablet twice daily for 3 to 4 days., Disp: 30 tablet, Rfl: 0   ipratropium (ATROVENT) 0.02 % nebulizer solution, Take by nebulization., Disp: , Rfl:    Ipratropium-Albuterol (COMBIVENT RESPIMAT) 20-100 MCG/ACT AERS respimat, Inhale 1 puff into the lungs every 6 (six) hours., Disp: 4 g, Rfl: 0   ondansetron (ZOFRAN) 4 MG tablet, Take 1 tablet (4 mg total) by mouth every 6 (six) hours., Disp: 12 tablet, Rfl: 0   triamcinolone ointment (KENALOG) 0.5 %, Apply 1 Application topically 2 (two) times daily., Disp: 30 g, Rfl: 0  Observations/Objective: Patient is well-developed, well-nourished in no acute distress.  Resting comfortably  at home.  Head is normocephalic, atraumatic.  No labored breathing.  Speech is clear and coherent with logical content.  Patient is alert and oriented at baseline.    Assessment and Plan: 1. Human immunodeficiency virus (HIV) disease (HCC)  2. Nausea and vomiting, unspecified vomiting type  Encouraged fluids, follow up with pcp this week. ER if sx worsen. No milk or dairy.   Follow Up Instructions: I discussed the assessment and treatment plan with the patient. The patient was provided an opportunity to ask questions and all were answered. The patient agreed with the plan and demonstrated an understanding of the instructions.  A copy of instructions were sent to the patient via MyChart unless otherwise noted below.     The patient was advised to call back or seek an  in-person evaluation if the symptoms worsen or if the condition fails to improve as anticipated.  Time:  I spent 10 minutes with the patient via telehealth technology discussing the above problems/concerns.    Gordon Curio, FNP

## 2021-10-04 ENCOUNTER — Other Ambulatory Visit: Payer: Self-pay | Admitting: Pharmacist

## 2021-10-04 DIAGNOSIS — B20 Human immunodeficiency virus [HIV] disease: Secondary | ICD-10-CM

## 2021-10-04 MED ORDER — BIKTARVY 50-200-25 MG PO TABS: 1.0000 | ORAL_TABLET | Freq: Every day | ORAL | 0 refills | Status: AC

## 2021-10-04 NOTE — Progress Notes (Signed)
Medication Samples have been provided to the patient. ? ?Drug name: Biktarvy        ?Strength: 50/200/25 mg       ?Qty: 28 tablets (4 bottles)    ?LOT: [id number]   ?Exp.Date: 11/2023 ? ?Dosing instructions: Take one tablet by mouth once daily ? ?The patient has been instructed regarding the correct time, dose, and frequency of taking this medication, including desired effects and most common side effects.  ? ?Marsia Cino L. Terron Merfeld, PharmD, BCIDP, AAHIVP, CPP ?Clinical Pharmacist Practitioner ?Infectious Diseases Clinical Pharmacist ?[facility] ?03/09/2020, 10:07 AM ? ?

## 2021-10-06 ENCOUNTER — Emergency Department (HOSPITAL_COMMUNITY)
Admission: EM | Admit: 2021-10-06 | Discharge: 2021-10-06 | Disposition: A | Discharge status: Home / Self Care | Payer: BC Managed Care – PPO | Attending: Emergency Medicine | Admitting: Emergency Medicine

## 2021-10-06 ENCOUNTER — Other Ambulatory Visit: Payer: Self-pay

## 2021-10-06 ENCOUNTER — Emergency Department (HOSPITAL_COMMUNITY): Payer: BC Managed Care – PPO

## 2021-10-06 ENCOUNTER — Encounter (HOSPITAL_COMMUNITY): Payer: Self-pay

## 2021-10-06 ENCOUNTER — Other Ambulatory Visit (HOSPITAL_COMMUNITY): Payer: Self-pay

## 2021-10-06 DIAGNOSIS — R1011 Right upper quadrant pain: Secondary | ICD-10-CM | POA: Diagnosis not present

## 2021-10-06 DIAGNOSIS — K529 Noninfective gastroenteritis and colitis, unspecified: Secondary | ICD-10-CM | POA: Diagnosis not present

## 2021-10-06 DIAGNOSIS — R109 Unspecified abdominal pain: Secondary | ICD-10-CM | POA: Diagnosis not present

## 2021-10-06 DIAGNOSIS — J45909 Unspecified asthma, uncomplicated: Secondary | ICD-10-CM | POA: Insufficient documentation

## 2021-10-06 DIAGNOSIS — R110 Nausea: Secondary | ICD-10-CM | POA: Insufficient documentation

## 2021-10-06 DIAGNOSIS — R1084 Generalized abdominal pain: Secondary | ICD-10-CM

## 2021-10-06 LAB — CBC
HCT: 46.5 % (ref 38.5–50.0)
Hemoglobin: 15.8 g/dL (ref 13.2–17.1)
MCH: 29.3 pg (ref 27.0–33.0)
MCHC: 34 g/dL (ref 32.0–36.0)
MCV: 86.3 fL (ref 80.0–100.0)
MPV: 8.8 fL (ref 7.5–12.5)
Platelets: 284 10*3/uL (ref 140–400)
RBC: 5.39 10*6/uL (ref 4.20–5.80)
RDW: 13.4 % (ref 11.0–15.0)
WBC: 3.5 10*3/uL — ABNORMAL LOW (ref 3.8–10.8)

## 2021-10-06 LAB — COMPLETE METABOLIC PANEL WITH GFR
AG Ratio: 1.3 (calc) (ref 1.0–2.5)
ALT: 24 U/L (ref 9–46)
AST: 25 U/L (ref 10–40)
Albumin: 4.3 g/dL (ref 3.6–5.1)
Alkaline phosphatase (APISO): 78 U/L (ref 36–130)
BUN: 14 mg/dL (ref 7–25)
CO2: 21 mmol/L (ref 20–32)
Calcium: 9.2 mg/dL (ref 8.6–10.3)
Chloride: 106 mmol/L (ref 98–110)
Creat: 0.8 mg/dL (ref 0.60–1.26)
Globulin: 3.2 g/dL (calc) (ref 1.9–3.7)
Glucose, Bld: 106 mg/dL — ABNORMAL HIGH (ref 65–99)
Potassium: 3.6 mmol/L (ref 3.5–5.3)
Sodium: 138 mmol/L (ref 135–146)
Total Bilirubin: 0.4 mg/dL (ref 0.2–1.2)
Total Protein: 7.5 g/dL (ref 6.1–8.1)
eGFR: 117 mL/min/{1.73_m2} (ref >=60)

## 2021-10-06 LAB — LIPID PANEL
Cholesterol: 152 mg/dL (ref <200)
HDL: 30 mg/dL — ABNORMAL LOW (ref >=40)
LDL Cholesterol (Calc): 91 mg/dL (calc)
Non-HDL Cholesterol (Calc): 122 mg/dL (calc) (ref <130)
Total CHOL/HDL Ratio: 5.1 (calc) — ABNORMAL HIGH (ref <5.0)
Triglycerides: 224 mg/dL — ABNORMAL HIGH (ref <150)

## 2021-10-06 LAB — COMPREHENSIVE METABOLIC PANEL
ALT: 25 U/L (ref 0–44)
AST: 23 U/L (ref 15–41)
Albumin: 4 g/dL (ref 3.5–5.0)
Alkaline Phosphatase: 66 U/L (ref 38–126)
Anion gap: 9 (ref 5–15)
BUN: 14 mg/dL (ref 6–20)
CO2: 23 mmol/L (ref 22–32)
Calcium: 9.3 mg/dL (ref 8.9–10.3)
Chloride: 105 mmol/L (ref 98–111)
Creatinine, Ser: 0.85 mg/dL (ref 0.61–1.24)
GFR, Estimated: >60 mL/min (ref >60)
Glucose, Bld: 114 mg/dL — ABNORMAL HIGH (ref 70–99)
Potassium: 3.3 mmol/L — ABNORMAL LOW (ref 3.5–5.1)
Sodium: 137 mmol/L (ref 135–145)
Total Bilirubin: 0.5 mg/dL (ref 0.3–1.2)
Total Protein: 7.9 g/dL (ref 6.5–8.1)

## 2021-10-06 LAB — CBC WITH DIFFERENTIAL/PLATELET
Abs Immature Granulocytes: 0.03 10*3/uL (ref 0.00–0.07)
Basophils Absolute: 0 10*3/uL (ref 0.0–0.1)
Basophils Relative: 0 %
Eosinophils Absolute: 0.1 10*3/uL (ref 0.0–0.5)
Eosinophils Relative: 2 %
HCT: 45.3 % (ref 39.0–52.0)
Hemoglobin: 15.5 g/dL (ref 13.0–17.0)
Immature Granulocytes: 1 %
Lymphocytes Relative: 34 %
Lymphs Abs: 2 10*3/uL (ref 0.7–4.0)
MCH: 29.4 pg (ref 26.0–34.0)
MCHC: 34.2 g/dL (ref 30.0–36.0)
MCV: 85.8 fL (ref 80.0–100.0)
Monocytes Absolute: 0.4 10*3/uL (ref 0.1–1.0)
Monocytes Relative: 7 %
Neutro Abs: 3.2 10*3/uL (ref 1.7–7.7)
Neutrophils Relative %: 56 %
Platelets: 270 10*3/uL (ref 150–400)
RBC: 5.28 MIL/uL (ref 4.22–5.81)
RDW: 13 % (ref 11.5–15.5)
WBC: 5.8 10*3/uL (ref 4.0–10.5)
nRBC: 0 % (ref 0.0–0.2)

## 2021-10-06 LAB — HIV-1 INTEGRASE GENOTYPE

## 2021-10-06 LAB — HIV RNA, RTPCR W/R GT (RTI, PI,INT)
HIV 1 RNA Quant: 10700 copies/mL — ABNORMAL HIGH
HIV-1 RNA Quant, Log: 4.03 Log copies/mL — ABNORMAL HIGH

## 2021-10-06 LAB — LIPASE, BLOOD: Lipase: 24 U/L (ref 11–51)

## 2021-10-06 LAB — RPR: RPR Ser Ql: NONREACTIVE

## 2021-10-06 LAB — HIV-1 GENOTYPE: HIV-1 Genotype: DETECTED — AB

## 2021-10-06 MED ORDER — IOHEXOL 300 MG/ML  SOLN
100.0000 mL | Freq: Once | INTRAMUSCULAR | Status: AC | PRN
Start: 2021-10-06 — End: 2021-10-06
  Administered 2021-10-06: 100 mL via INTRAVENOUS

## 2021-10-06 MED ORDER — CIPROFLOXACIN IN D5W 400 MG/200ML IV SOLN
400.0000 mg | Freq: Once | INTRAVENOUS | Status: AC
  Administered 2021-10-06: 400 mg via INTRAVENOUS
  Filled 2021-10-06: qty 200

## 2021-10-06 MED ORDER — METRONIDAZOLE 500 MG/100ML IV SOLN
500.0000 mg | Freq: Once | INTRAVENOUS | Status: AC
  Administered 2021-10-06: 500 mg via INTRAVENOUS
  Filled 2021-10-06: qty 100

## 2021-10-06 MED ORDER — ONDANSETRON HCL 4 MG/2ML IJ SOLN
4.0000 mg | Freq: Once | INTRAMUSCULAR | Status: AC
  Administered 2021-10-06: 4 mg via INTRAVENOUS
  Filled 2021-10-06: qty 2

## 2021-10-06 MED ORDER — SODIUM CHLORIDE (PF) 0.9 % IJ SOLN
INTRAMUSCULAR | Status: AC
  Filled 2021-10-06: qty 50

## 2021-10-06 MED ORDER — METRONIDAZOLE 500 MG PO TABS
500.0000 mg | ORAL_TABLET | Freq: Two times a day (BID) | ORAL | 0 refills | Status: DC
Start: 2021-10-06 — End: 2021-12-02
  Filled 2021-10-06: qty 14, 7d supply, fill #0

## 2021-10-06 MED ORDER — CIPROFLOXACIN HCL 500 MG PO TABS
500.0000 mg | ORAL_TABLET | Freq: Two times a day (BID) | ORAL | 0 refills | Status: DC
  Filled 2021-10-06: qty 14, 7d supply, fill #0

## 2021-10-06 MED ORDER — MORPHINE SULFATE (PF) 4 MG/ML IV SOLN
4.0000 mg | Freq: Once | INTRAVENOUS | Status: AC
  Administered 2021-10-06: 4 mg via INTRAVENOUS
  Filled 2021-10-06: qty 1

## 2021-10-06 NOTE — ED Provider Notes (Signed)
Care assumed from Dr. Wilkie Aye.  Patient with history of HIV and multiple abdominal surgeries as a child here with abdominal pain, nausea and concern for bowel obstruction.  CT scan shows IMPRESSION: 1. There is mild scattered segmental gaseous filled dilatation of the small bowel loops seen. There is moderate thickening of segment of small bowel loop at the anterior left mid abdomen with subjacent mild-to-moderate inflammatory stranding (images 39-43 of series 2). There is another segment of small bowel wall thickening at the left mid pelvis. The differential considerations are lymphoma, Crohn's disease, infectious enteritis etc. These findings are new since the previous study.   2.  Small fluid collection at the dependent pelvis.   3.  Small hiatal hernia.  Patient reports feeling improved.  Abdomen is soft.  Able to tolerate p.o.  He reports having a colonoscopy in 2018 that showed nonspecific inflammation was diagnosed with possible celiac disease.  There was also some concern for possible Crohn's disease.  He does not have a GI doctor in the area.  Discussed with Dr. Ewing Schlein gastroenterology who reviewed patient's CT.  He agrees with empiric antibiotics at this point.  He will arrange for patient to be seen in the office this week.  Does not recommend any steroids currently.  Patient has GI appointment in 2 days.  He is tolerating p.o.  Vitals are stable.  Admission to the hospital offered but declined by patient.  Continue antibiotics and GI follow-up.  Return to the ED with worsening symptoms.   [name], MD 10/06/21 1126

## 2021-10-06 NOTE — ED Notes (Signed)
Pt given ginger ale.

## 2021-10-06 NOTE — ED Provider Notes (Signed)
[facility]-EMERGENCY DEPT Provider Note   CSN: [id number] Arrival date & time: 10/06/21  0349     History  Chief Complaint  Patient presents with   Abdominal Pain    Gordon Turner is a 36 y.o. male.  HPI     This is a 36 year old male with a history of SBO who presents with abdominal pain and nausea.  Patient reports he has had nausea for the last week.  Over the last 24 to 48 hours he has had onset of sharp abdominal pain over the left mid abdomen which is similar to his prior bowel obstruction symptoms.  He has not had a bowel movement in the last 12 hours.  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  Denies fevers.  Home Medications Prior to Admission medications   Medication Sig Start Date End Date Taking? Authorizing Provider  albuterol (VENTOLIN HFA) 108 (90 Base) MCG/ACT inhaler Inhale 1-2 puffs into the lungs every 6 (six) hours as needed for wheezing or shortness of breath. 05/09/19  Yes Roxy Horseman, PA-C  bictegravir-emtricitabine-tenofovir AF (BIKTARVY) 50-200-25 MG TABS tablet Take 1 tablet by mouth daily for 28 days. 09/30/21 10/28/21 Yes Kuppelweiser, Cassie L, RPH-CPP  diphenhydrAMINE (BENADRYL) 25 MG tablet 1 to 2 tablets every 6 hours for itching and swelling Patient taking differently: Take 25 mg by mouth at bedtime as needed for sleep. 01/12/18  Yes Arby Barrette, MD  ipratropium (ATROVENT) 0.02 % nebulizer solution Take 0.5 mg by nebulization every 6 (six) hours as needed for shortness of breath or wheezing. 05/11/20  Yes [provider]  Ipratropium-Albuterol (COMBIVENT RESPIMAT) 20-100 MCG/ACT AERS respimat Inhale 1 puff into the lungs every 6 (six) hours. Patient taking differently: Inhale 1 puff into the lungs every 6 (six) hours as needed for shortness of breath. 05/09/19  Yes Mickie Bail, NP  ondansetron (ZOFRAN-ODT) 4 MG disintegrating tablet Take 1 tablet (4 mg total) by mouth every 8 (eight) hours as needed for  nausea or vomiting. 10/03/21  Yes Delorse Lek, FNP  triamcinolone ointment (KENALOG) 0.5 % Apply 1 Application topically 2 (two) times daily. 09/30/21  Yes Blanchard Kelch, NP      Allergies    Amoxicillin and Penicillins    Review of Systems   Review of Systems  Constitutional:  Negative for fever.  Respiratory:  Negative for shortness of breath.   Cardiovascular:  Negative for chest pain.  Gastrointestinal:  Positive for abdominal pain and nausea. Negative for diarrhea and vomiting.  All other systems reviewed and are negative.   Physical Exam Updated Vital Signs BP 116/73   Pulse 63   Temp 98.4 F (36.9 C) (Oral)   Resp 20   Ht 1.753 m (5\' 9" )   Wt 77.1 kg   SpO2 95%   BMI 25.10 kg/m  Physical Exam Vitals and nursing note reviewed.  Constitutional:      Appearance: He is well-developed. He is not ill-appearing.  HENT:     Head: Normocephalic and atraumatic.  Eyes:     Pupils: Pupils are equal, round, and reactive to light.  Cardiovascular:     Rate and Rhythm: Normal rate and regular rhythm.     Heart sounds: Normal heart sounds. No murmur heard. Pulmonary:     Effort: Pulmonary effort is normal. No respiratory distress.     Breath sounds: Normal breath sounds. No wheezing.  Abdominal:     General: Bowel sounds are normal.  Palpations: Abdomen is soft.     Tenderness: There is abdominal tenderness in the left upper quadrant. There is no guarding or rebound.  Musculoskeletal:     Cervical back: Neck supple.  Lymphadenopathy:     Cervical: No cervical adenopathy.  Skin:    General: Skin is warm and dry.  Neurological:     Mental Status: He is alert and oriented to person, place, and time.  Psychiatric:        Mood and Affect: Mood normal.     ED Results / Procedures / Treatments   Labs (all labs ordered are listed, but only abnormal results are displayed) Labs Reviewed  COMPREHENSIVE METABOLIC PANEL - Abnormal; Notable for the following components:       Result Value   Potassium 3.3 (*)    Glucose, Bld 114 (*)    All other components within normal limits  CBC WITH DIFFERENTIAL/PLATELET  LIPASE, BLOOD  URINALYSIS, ROUTINE W REFLEX MICROSCOPIC    EKG None  Radiology No results found.  Procedures Procedures    Medications Ordered in ED Medications  morphine (PF) 4 MG/ML injection 4 mg (4 mg Intravenous Given 10/06/21 0518)  ondansetron (ZOFRAN) injection 4 mg (4 mg Intravenous Given 10/06/21 7829)    ED Course/ Medical Decision Making/ A&P                           Medical Decision Making Risk Prescription drug management.   This patient presents to the ED for concern of abdominal pain, this involves an extensive number of treatment options, and is a complaint that carries with it a high risk of complications and morbidity.  I considered the following differential and admission for this acute, potentially life threatening condition.  The differential diagnosis includes SBO, gastritis, pancreatitis, appendicitis, cholecystitis  MDM:    This is a 36 year old male with history of SBO who presents with symptoms consistent with same.  He is nontoxic and vital signs are reassuring.  He has some tenderness to palpation without signs of peritonitis.  Labs obtained.  Labs largely reassuring.  Patient given pain and nausea medication.  Will obtain CT scan given his history.  (Labs, imaging, consults)  Labs: I Ordered, and personally interpreted labs.  The pertinent results include: CBC, CMP, lipase normal  Imaging Studies ordered: I ordered imaging studies including CT pending I independently visualized and interpreted imaging. I agree with the radiologist interpretation  Additional history obtained from chart review.  External records from outside source obtained and reviewed including prior admission for bowel obstruction  Cardiac Monitoring: The patient was maintained on a cardiac monitor.  I personally viewed and  interpreted the cardiac monitored which showed an underlying rhythm of: Sinus rhythm  Reevaluation: After the interventions noted above, I reevaluated the patient and found that they have :improved  Social Determinants of Health: Lives independently, HIV positive  Disposition: Pending  Co morbidities that complicate the patient evaluation  Past Medical History:  Diagnosis Date   Asthma    HIV (human immunodeficiency virus infection) (HCC)      Medicines Meds ordered this encounter  Medications   morphine (PF) 4 MG/ML injection 4 mg   ondansetron (ZOFRAN) injection 4 mg    I have reviewed the patients home medicines and have made adjustments as needed  Problem List / ED Course: Problem List Items Addressed This Visit   None  Final Clinical Impression(s) / ED Diagnoses Final diagnoses:  None    Rx / DC Orders ED Discharge Orders     None         Ridhi Hoffert, Mayer Masker, MD 10/06/21 [phone]

## 2021-10-06 NOTE — Discharge Instructions (Signed)
As we discussed, your CT scan shows inflammation of your intestines which is nonspecific but may be due to infection, Crohn's disease or other problems.  Take the antibiotics as prescribed and follow-up with the gastroenterologist on Friday as scheduled.  Return to the ED with worsening pain, fever, vomiting, not able to eat or drink or other concerns

## 2021-10-06 NOTE — ED Triage Notes (Signed)
Patient said he had a bowel blockage 3 years ago and the abdominal pain he is having is in the same exact spot, left lower quadrant. Pain began yesterday at 2pm. Last bowel movement was yesterday, has not vomited, but he says he feels like he is being stabbed. Last time he had the blockage the NG tube resolved it.

## 2021-10-07 DIAGNOSIS — I872 Venous insufficiency (chronic) (peripheral): Secondary | ICD-10-CM | POA: Diagnosis not present

## 2021-10-08 ENCOUNTER — Other Ambulatory Visit: Payer: Self-pay | Admitting: Gastroenterology

## 2021-10-08 DIAGNOSIS — R935 Abnormal findings on diagnostic imaging of other abdominal regions, including retroperitoneum: Secondary | ICD-10-CM

## 2021-10-14 ENCOUNTER — Ambulatory Visit
Admission: RE | Admit: 2021-10-14 | Discharge: 2021-10-14 | Disposition: A | Discharge status: Home / Self Care | Payer: BC Managed Care – PPO | Source: Ambulatory Visit | Attending: Gastroenterology | Admitting: Gastroenterology

## 2021-10-14 DIAGNOSIS — R935 Abnormal findings on diagnostic imaging of other abdominal regions, including retroperitoneum: Secondary | ICD-10-CM

## 2021-10-14 DIAGNOSIS — R160 Hepatomegaly, not elsewhere classified: Secondary | ICD-10-CM | POA: Diagnosis not present

## 2021-10-14 DIAGNOSIS — K760 Fatty (change of) liver, not elsewhere classified: Secondary | ICD-10-CM | POA: Diagnosis not present

## 2021-10-14 MED ORDER — IOPAMIDOL (ISOVUE-300) INJECTION 61%
100.0000 mL | Freq: Once | INTRAVENOUS | Status: AC | PRN
  Administered 2021-10-14: 100 mL via INTRAVENOUS

## 2021-10-28 ENCOUNTER — Other Ambulatory Visit: Payer: BC Managed Care – PPO

## 2021-11-06 ENCOUNTER — Telehealth: Payer: BC Managed Care – PPO | Admitting: Nurse Practitioner

## 2021-11-06 ENCOUNTER — Encounter: Payer: BC Managed Care – PPO | Admitting: Nurse Practitioner

## 2021-11-06 DIAGNOSIS — R110 Nausea: Secondary | ICD-10-CM

## 2021-11-06 MED ORDER — ONDANSETRON 4 MG PO TBDP: 4.0000 mg | ORAL_TABLET | Freq: Three times a day (TID) | ORAL | 1 refills | Status: DC | PRN

## 2021-11-06 NOTE — Progress Notes (Signed)
E-Visit for Nausea and Vomiting   THERE WILL BE NO CHARGE FOR THE PREVIOUS VIDEO VISIT.  We are sorry that you are not feeling well. Here is how we plan to help!  Based on what you have shared with me it looks like you have a Virus that is irritating your GI tract.  Vomiting is the forceful emptying of a portion of the stomach's content through the mouth.  Although nausea and vomiting can make you feel miserable, it's important to remember that these are not diseases, but rather symptoms of an underlying illness.  When we treat short term symptoms, we always caution that any symptoms that persist should be fully evaluated in a medical office.  I am refilling your zofran that will help alleviate your symptoms associated with your IBS    HOME CARE: Drink clear liquids.  This is very important! Dehydration (the lack of fluid) can lead to a serious complication.  Start off with 1 tablespoon every 5 minutes for 8 hours. You may begin eating bland foods after 8 hours without vomiting.  Start with saltine crackers, white bread, rice, mashed potatoes, applesauce. After 48 hours on a bland diet, you may resume a normal diet. Try to go to sleep.  Sleep often empties the stomach and relieves the need to vomit.  GET HELP RIGHT AWAY IF:  Your symptoms do not improve or worsen within 2 days after treatment. You have a fever for over 3 days. You cannot keep down fluids after trying the medication.  MAKE SURE YOU:  Understand these instructions. Will watch your condition. Will get help right away if you are not doing well or get worse.    Thank you for choosing an e-visit.  Your e-visit answers were reviewed by a board certified advanced clinical practitioner to complete your personal care plan. Depending upon the condition, your plan could have included both over the counter or prescription medications.  Please review your pharmacy choice. Make sure the pharmacy is open so you can pick up  prescription now. If there is a problem, you may contact your provider through [employer] and have the prescription routed to another pharmacy.  Your safety is important to [place]. If you have drug allergies check your prescription carefully.   For the next 24 hours you can use MyChart to ask questions about today's visit, request a non-urgent call back, or ask for a work or school excuse. You will get an email in the next two days asking about your experience. I hope that your e-visit has been valuable and will speed your recovery.

## 2021-11-06 NOTE — Progress Notes (Signed)
I have spent 5 minutes in review of e-visit questionnaire, review and updating patient chart, medical decision making and response to patient.  ° °Froylan Hobby W Kyesha Balla, NP ° °  °

## 2021-11-06 NOTE — Progress Notes (Signed)
Patient had evisit for same concern

## 2021-11-18 DIAGNOSIS — R935 Abnormal findings on diagnostic imaging of other abdominal regions, including retroperitoneum: Secondary | ICD-10-CM | POA: Diagnosis not present

## 2021-11-23 ENCOUNTER — Ambulatory Visit: Payer: BC Managed Care – PPO | Admitting: Infectious Diseases

## 2021-12-02 ENCOUNTER — Telehealth: Payer: BC Managed Care – PPO | Admitting: Physician Assistant

## 2021-12-02 DIAGNOSIS — B001 Herpesviral vesicular dermatitis: Secondary | ICD-10-CM

## 2021-12-02 MED ORDER — VALACYCLOVIR HCL 1 G PO TABS: 2000.0000 mg | ORAL_TABLET | Freq: Two times a day (BID) | ORAL | 0 refills | Status: AC

## 2021-12-02 NOTE — Progress Notes (Signed)
Virtual Visit Consent   Gordon Turner, you are scheduled for a virtual visit with a [facility] provider today. Just as with appointments in the office, your consent must be obtained to participate. Your consent will be active for this visit and any virtual visit you may have with one of our providers in the next 365 days. If you have a MyChart account, a copy of this consent can be sent to you electronically.  As this is a virtual visit, video technology does not allow for your provider to perform a traditional examination. This may limit your provider's ability to fully assess your condition. If your provider identifies any concerns that need to be evaluated in person or the need to arrange testing (such as labs, EKG, etc.), we will make arrangements to do so. Although advances in technology are sophisticated, we cannot ensure that it will always work on either your end or our end. If the connection with a video visit is poor, the visit may have to be switched to a telephone visit. With either a video or telephone visit, we are not always able to ensure that we have a secure connection.  By engaging in this virtual visit, you consent to the provision of healthcare and authorize for your insurance to be billed (if applicable) for the services provided during this visit. Depending on your insurance coverage, you may receive a charge related to this service.  I need to obtain your verbal consent now. Are you willing to proceed with your visit today? Gordon Turner has provided verbal consent on 12/02/2021 for a virtual visit (video or telephone). [name], [place]  Date: 12/02/2021 8:06 AM  Virtual Visit via Video Note   I, [name], connected with  Gordon Turner  ([id number], [birth year]) on 12/02/21 at  8:00 AM EDT by a video-enabled telemedicine application and verified that I am speaking with the correct person using two identifiers.  Location: Patient: Virtual Visit Location Patient:  Home Provider: Virtual Visit Location Provider: Home Office   I discussed the limitations of evaluation and management by telemedicine and the availability of in person appointments. The patient expressed understanding and agreed to proceed.    History of Present Illness: Gordon Turner is a 36 y.o. who identifies as a male who was assigned male at birth, and is being seen today for possible cold sore. Noted some itching and mild swelling of lip yesterday after drinking some pineapple juice. Initially thought mild reaction but this has never happened before. Itching and then tingling continued overnight with development of blistering lesion of upper lip. Denies any tongue itching, swelling, SOB. Has history of cold sores and this seems similar. Had more juice this morning without any issue. Notes some significant increase in work stressors recently. Patient also with HIV infection, consistent with antivirals now.  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   Healthcare maintenance 09/22/2021   SBO (small bowel obstruction) (HCC) 03/31/2018   Celiac disease 10/20/2016   Moderate persistent asthma with exacerbation 05/12/2016   Depressive disorder 12/26/2013   Allergic rhinitis due to pollen 09/25/2013   Insomnia 09/25/2013   Panic disorder without agoraphobia 09/25/2013   Chronic sinusitis 04/04/2013   Human immunodeficiency virus (HIV) disease (HCC) 09/27/2011    Allergies:  Allergies  Allergen Reactions   Amoxicillin Anaphylaxis   Penicillins Anaphylaxis, Hives, Swelling and Other (See Comments)    DID THE REACTION  INVOLVE: Swelling of the face/tongue/throat, SOB, or low BP? No Sudden or severe rash/hives, skin peeling, or the inside of the mouth or nose? No Did it require medical treatment? No When did it last happen?     baby If all above answers are "NO", may proceed with cephalosporin use.   Medications:  Current  Outpatient Medications:    valACYclovir (VALTREX) 1000 MG tablet, Take 2 tablets (2,000 mg total) by mouth 2 (two) times daily for 1 day., Disp: 4 tablet, Rfl: 0   albuterol (VENTOLIN HFA) 108 (90 Base) MCG/ACT inhaler, Inhale 1-2 puffs into the lungs every 6 (six) hours as needed for wheezing or shortness of breath., Disp: 18 g, Rfl: 1   ciprofloxacin (CIPRO) 500 MG tablet, Take 1 tablet (500 mg total) by mouth every 12 (twelve) hours., Disp: 14 tablet, Rfl: 0   diphenhydrAMINE (BENADRYL) 25 MG tablet, 1 to 2 tablets every 6 hours for itching and swelling (Patient taking differently: Take 25 mg by mouth at bedtime as needed for sleep.), Disp: 20 tablet, Rfl: 0   ipratropium (ATROVENT) 0.02 % nebulizer solution, Take 0.5 mg by nebulization every 6 (six) hours as needed for shortness of breath or wheezing., Disp: , Rfl:    Ipratropium-Albuterol (COMBIVENT RESPIMAT) 20-100 MCG/ACT AERS respimat, Inhale 1 puff into the lungs every 6 (six) hours. (Patient taking differently: Inhale 1 puff into the lungs every 6 (six) hours as needed for shortness of breath.), Disp: 4 g, Rfl: 0   ondansetron (ZOFRAN-ODT) 4 MG disintegrating tablet, Take 1 tablet (4 mg total) by mouth every 8 (eight) hours as needed for nausea or vomiting., Disp: 30 tablet, Rfl: 1   triamcinolone ointment (KENALOG) 0.5 %, Apply 1 Application topically 2 (two) times daily., Disp: 30 g, Rfl: 0  Observations/Objective: Patient is well-developed, well-nourished in no acute distress.  Resting comfortably at home.  Head is normocephalic, atraumatic.  No labored breathing. Speech is clear and coherent with logical content.  Patient is alert and oriented at baseline.  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  Assessment and Plan: 1. Cold sore - valACYclovir (VALTREX) 1000 MG tablet; Take 2 tablets (2,000 mg total) by mouth 2 (two) times daily for 1 day.  Dispense: 4 tablet; Refill: 0  Start Valacyclovir. Topical Abreva  recommended. Also recommend thin layer of Vaseline to protect the skin.   Follow Up Instructions: I discussed the assessment and treatment plan with the patient. The patient was provided an opportunity to ask questions and all were answered. The patient agreed with the plan and demonstrated an understanding of the instructions.  A copy of instructions were sent to the patient via MyChart unless otherwise noted below.   The patient was advised to call back or seek an in-person evaluation if the symptoms worsen or if the condition fails to improve as anticipated.  Time:  I spent 8 minutes with the patient via telehealth technology discussing the above problems/concerns.    [name], PA-C

## 2021-12-02 NOTE — Patient Instructions (Signed)
Gordon Turner, thank you for joining [name], PA-C for today's virtual visit.  While this provider is not your primary care provider (PCP), if your PCP is located in our provider database this encounter information will be shared with them immediately following your visit.  Consent: (Patient) Gordon Turner provided verbal consent for this virtual visit at the beginning of the encounter.  Current Medications:  Current Outpatient Medications:    albuterol (VENTOLIN HFA) 108 (90 Base) MCG/ACT inhaler, Inhale 1-2 puffs into the lungs every 6 (six) hours as needed for wheezing or shortness of breath., Disp: 18 g, Rfl: 1   ciprofloxacin (CIPRO) 500 MG tablet, Take 1 tablet (500 mg total) by mouth every 12 (twelve) hours., Disp: 14 tablet, Rfl: 0   diphenhydrAMINE (BENADRYL) 25 MG tablet, 1 to 2 tablets every 6 hours for itching and swelling (Patient taking differently: Take 25 mg by mouth at bedtime as needed for sleep.), Disp: 20 tablet, Rfl: 0   ipratropium (ATROVENT) 0.02 % nebulizer solution, Take 0.5 mg by nebulization every 6 (six) hours as needed for shortness of breath or wheezing., Disp: , Rfl:    Ipratropium-Albuterol (COMBIVENT RESPIMAT) 20-100 MCG/ACT AERS respimat, Inhale 1 puff into the lungs every 6 (six) hours. (Patient taking differently: Inhale 1 puff into the lungs every 6 (six) hours as needed for shortness of breath.), Disp: 4 g, Rfl: 0   metroNIDAZOLE (FLAGYL) 500 MG tablet, Take 1 tablet (500 mg total) by mouth 2 (two) times daily., Disp: 14 tablet, Rfl: 0   ondansetron (ZOFRAN-ODT) 4 MG disintegrating tablet, Take 1 tablet (4 mg total) by mouth every 8 (eight) hours as needed for nausea or vomiting., Disp: 30 tablet, Rfl: 1   triamcinolone ointment (KENALOG) 0.5 %, Apply 1 Application topically 2 (two) times daily., Disp: 30 g, Rfl: 0   Medications ordered in this encounter:  No orders of the defined types were placed in this encounter.    *If you need refills on  other medications prior to your next appointment, please contact your pharmacy*  Follow-Up: Call back or seek an in-person evaluation if the symptoms worsen or if the condition fails to improve as anticipated.  Other Instructions Take the Valacyclovir as directed.  Topical Abreva and vaseline as discussed. Follow-up in person if symptoms are not resolving or you note any new/worsening symptoms despite treatment.   Cold Sore  A cold sore, also called a fever blister, is a small, fluid-filled sore that forms inside of the mouth or on the lips, gums, nose, chin, or cheeks. Cold sores can spread to other parts of the body, such as the eyes, fingers, or genitals. Cold sores can spread from person to person (are contagious) until the sores crust over completely. Most cold sores go away within 2 weeks. What are the causes? Cold sores are caused by a virus (herpes simplex virus type 1, HSV-1). The virus can spread from person to person through close contact, such as through: Kissing. Touching the affected area. Sharing personal items such as lip balm, razors, a drinking glass, or eating utensils. What increases the risk? Being tired, stressed, or sick. Having your period (menstruating). Being pregnant. Taking certain medicines. Being out in cold weather or getting too much sun. What are the signs or symptoms? Symptoms of a cold sore go through different stages: Tingling, itching, or burning is felt 1-2 days before the cold sore appears. Fluid-filled blisters appear on the lips, inside the mouth, on the nose, or on the  cheeks. The blisters start to ooze clear fluid. The blisters dry up, and a yellow crust appears in their place. The crust falls off. In some cases, other symptoms can develop along with cold sores. These can include: Fever. Sore throat. Headache. Muscle aches. Swollen neck glands. How is this treated? There is no cure for cold sores or the virus that causes them. There is  also no vaccine to prevent the virus. Most cold sores go away on their own without treatment within 2 weeks. Your doctor may prescribe medicines to: Help with pain. Keep the virus from growing. Help you heal faster. Medicines may be in the form of creams, gels, pills, or a shot. Follow these instructions at home: Medicines Take or apply over-the-counter and prescription medicines only as told by your doctor. Use a cotton-tip swab to apply creams or gels to your sores. Ask your doctor if you can take lysine supplements. These may help with healing. Sore care  Do not touch the sores or pick the scabs. Wash your hands often with soap and water for at least 20 seconds. Do not touch your eyes without washing your hands first. Keep the sores clean and dry. If told, put ice on the sores. To do this: Put ice in a plastic bag. Place a towel between your skin and the bag. Leave the ice on for 20 minutes, 2-3 times a day. Take off the ice if your skin turns bright red. This is very important. If you cannot feel pain, heat, or cold, you have a greater risk of damage to the area. Eating and drinking Eat a soft, bland diet. Avoid eating hot, cold, or salty foods. These can hurt your mouth. Use a straw if it hurts to drink out of a glass. Eat foods that have a lot of lysine in them. These include meat, fish, and dairy products. Avoid sugary foods, chocolates, nuts, and grains. These foods have a high amount of a substance (arginine) that can cause the virus to grow. Lifestyle Do not kiss, have oral sex, or share personal items until your sores heal. Stress, poor sleep, and being out in the sun can trigger a cold sore. Make sure you: Do activities that help you relax, such as deep breathing exercises or meditation. Get enough sleep. Put sunscreen on your lips before you go out in the sun. Contact a doctor if: You have symptoms for more than 2 weeks. You have pus coming from the sores. You have  redness that is spreading. You have pain or irritation in your eye. You get sores on your genitals. Your sores do not heal within 2 weeks. You get cold sores often. Get help right away if: You have a fever and your symptoms suddenly get worse. You have a headache and confusion. You have tiredness (fatigue). You do not want to eat as much as normal (loss of appetite). You have a stiff neck or are sensitive to light. Summary A cold sore is a small, fluid-filled sore that forms inside of the mouth or on the lips, gums, nose, chin, or cheeks. Cold sores can spread from person to person (are contagious) until the sores crust over completely. Most cold sores go away within 2 weeks. Wash your hands often. Do not touch your eyes without washing your hands first. Do not kiss, have oral sex, or share personal items until your sores heal. Contact a doctor if your sores do not heal within 2 weeks. This information is not intended to  replace advice given to you by your health care provider. Make sure you discuss any questions you have with your health care provider. Document Revised: 12/23/2020 Document Reviewed: 12/23/2020 Elsevier Patient Education  [place].    If you have been instructed to have an in-person evaluation today at a local Urgent Care facility, please use the link below. It will take you to a list of all of our available [facility], including address, phone number and hours of operation. Please do not delay care.  [facility]  If you or a family member do not have a primary care provider, use the link below to schedule a visit and establish care. When you choose a [place] primary care physician or advanced practice provider, you gain a long-term partner in health. Find a Primary Care Provider  Learn more about [place]'s in-office and virtual care options: [facility]

## 2021-12-16 ENCOUNTER — Emergency Department (HOSPITAL_COMMUNITY): Payer: BC Managed Care – PPO

## 2021-12-16 ENCOUNTER — Inpatient Hospital Stay (HOSPITAL_COMMUNITY)
Admission: EM | Admit: 2021-12-16 | Discharge: 2021-12-20 | DRG: 389 | Disposition: A | Discharge status: Home / Self Care | Payer: BC Managed Care – PPO | Attending: Internal Medicine | Admitting: Internal Medicine

## 2021-12-16 ENCOUNTER — Inpatient Hospital Stay (HOSPITAL_COMMUNITY): Payer: BC Managed Care – PPO

## 2021-12-16 ENCOUNTER — Encounter (HOSPITAL_COMMUNITY): Payer: Self-pay | Admitting: Emergency Medicine

## 2021-12-16 DIAGNOSIS — Z79899 Other long term (current) drug therapy: Secondary | ICD-10-CM

## 2021-12-16 DIAGNOSIS — Z880 Allergy status to penicillin: Secondary | ICD-10-CM | POA: Diagnosis not present

## 2021-12-16 DIAGNOSIS — R109 Unspecified abdominal pain: Secondary | ICD-10-CM | POA: Diagnosis not present

## 2021-12-16 DIAGNOSIS — E869 Volume depletion, unspecified: Secondary | ICD-10-CM | POA: Diagnosis not present

## 2021-12-16 DIAGNOSIS — R9431 Abnormal electrocardiogram [ECG] [EKG]: Secondary | ICD-10-CM | POA: Diagnosis not present

## 2021-12-16 DIAGNOSIS — K900 Celiac disease: Secondary | ICD-10-CM | POA: Diagnosis not present

## 2021-12-16 DIAGNOSIS — Z6825 Body mass index (BMI) 25.0-25.9, adult: Secondary | ICD-10-CM

## 2021-12-16 DIAGNOSIS — J4541 Moderate persistent asthma with (acute) exacerbation: Secondary | ICD-10-CM | POA: Diagnosis present

## 2021-12-16 DIAGNOSIS — E876 Hypokalemia: Secondary | ICD-10-CM | POA: Diagnosis not present

## 2021-12-16 DIAGNOSIS — E663 Overweight: Secondary | ICD-10-CM | POA: Diagnosis not present

## 2021-12-16 DIAGNOSIS — K56609 Unspecified intestinal obstruction, unspecified as to partial versus complete obstruction: Secondary | ICD-10-CM | POA: Diagnosis not present

## 2021-12-16 DIAGNOSIS — K6389 Other specified diseases of intestine: Secondary | ICD-10-CM | POA: Diagnosis not present

## 2021-12-16 DIAGNOSIS — K5790 Diverticulosis of intestine, part unspecified, without perforation or abscess without bleeding: Secondary | ICD-10-CM | POA: Diagnosis not present

## 2021-12-16 DIAGNOSIS — K760 Fatty (change of) liver, not elsewhere classified: Secondary | ICD-10-CM | POA: Diagnosis not present

## 2021-12-16 DIAGNOSIS — B20 Human immunodeficiency virus [HIV] disease: Secondary | ICD-10-CM | POA: Diagnosis present

## 2021-12-16 DIAGNOSIS — K5669 Other partial intestinal obstruction: Secondary | ICD-10-CM | POA: Diagnosis not present

## 2021-12-16 DIAGNOSIS — Z4682 Encounter for fitting and adjustment of non-vascular catheter: Secondary | ICD-10-CM | POA: Diagnosis not present

## 2021-12-16 LAB — CBC WITH DIFFERENTIAL/PLATELET
Abs Immature Granulocytes: 0.02 10*3/uL (ref 0.00–0.07)
Basophils Absolute: 0 10*3/uL (ref 0.0–0.1)
Basophils Relative: 0 %
Eosinophils Absolute: 0.1 10*3/uL (ref 0.0–0.5)
Eosinophils Relative: 1 %
HCT: 46.3 % (ref 39.0–52.0)
Hemoglobin: 15.7 g/dL (ref 13.0–17.0)
Immature Granulocytes: 0 %
Lymphocytes Relative: 23 %
Lymphs Abs: 1.5 10*3/uL (ref 0.7–4.0)
MCH: 29.3 pg (ref 26.0–34.0)
MCHC: 33.9 g/dL (ref 30.0–36.0)
MCV: 86.4 fL (ref 80.0–100.0)
Monocytes Absolute: 0.4 10*3/uL (ref 0.1–1.0)
Monocytes Relative: 7 %
Neutro Abs: 4.5 10*3/uL (ref 1.7–7.7)
Neutrophils Relative %: 69 %
Platelets: 267 10*3/uL (ref 150–400)
RBC: 5.36 MIL/uL (ref 4.22–5.81)
RDW: 12.7 % (ref 11.5–15.5)
WBC: 6.5 10*3/uL (ref 4.0–10.5)
nRBC: 0 % (ref 0.0–0.2)

## 2021-12-16 LAB — LIPASE, BLOOD: Lipase: 26 U/L (ref 11–51)

## 2021-12-16 LAB — COMPREHENSIVE METABOLIC PANEL
ALT: 22 U/L (ref 0–44)
AST: 25 U/L (ref 15–41)
Albumin: 4.2 g/dL (ref 3.5–5.0)
Alkaline Phosphatase: 74 U/L (ref 38–126)
Anion gap: 8 (ref 5–15)
BUN: 14 mg/dL (ref 6–20)
CO2: 22 mmol/L (ref 22–32)
Calcium: 9.4 mg/dL (ref 8.9–10.3)
Chloride: 110 mmol/L (ref 98–111)
Creatinine, Ser: 0.8 mg/dL (ref 0.61–1.24)
GFR, Estimated: >60 mL/min (ref >60)
Glucose, Bld: 116 mg/dL — ABNORMAL HIGH (ref 70–99)
Potassium: 3.4 mmol/L — ABNORMAL LOW (ref 3.5–5.1)
Sodium: 140 mmol/L (ref 135–145)
Total Bilirubin: 0.6 mg/dL (ref 0.3–1.2)
Total Protein: 8.2 g/dL — ABNORMAL HIGH (ref 6.5–8.1)

## 2021-12-16 LAB — URINALYSIS, ROUTINE W REFLEX MICROSCOPIC
Bilirubin Urine: NEGATIVE
Glucose, UA: NEGATIVE mg/dL
Hgb urine dipstick: NEGATIVE
Ketones, ur: NEGATIVE mg/dL
Leukocytes,Ua: NEGATIVE
Nitrite: NEGATIVE
Protein, ur: NEGATIVE mg/dL
Specific Gravity, Urine: 1.042 — ABNORMAL HIGH (ref 1.005–1.030)
pH: 6 (ref 5.0–8.0)

## 2021-12-16 LAB — MAGNESIUM: Magnesium: 2.1 mg/dL (ref 1.7–2.4)

## 2021-12-16 LAB — LACTIC ACID, PLASMA
Lactic Acid, Venous: 1 mmol/L (ref 0.5–1.9)
Lactic Acid, Venous: 0.8 mmol/L (ref 0.5–1.9)

## 2021-12-16 MED ORDER — ACETAMINOPHEN 650 MG RE SUPP
650.0000 mg | Freq: Four times a day (QID) | RECTAL | Status: DC | PRN
  Administered 2021-12-17: 650 mg via RECTAL
  Filled 2021-12-16: qty 1

## 2021-12-16 MED ORDER — HYDROMORPHONE HCL 1 MG/ML IJ SOLN
0.5000 mg | Freq: Once | INTRAMUSCULAR | Status: AC
  Administered 2021-12-16: 0.5 mg via INTRAVENOUS
  Filled 2021-12-16: qty 1

## 2021-12-16 MED ORDER — ACETAMINOPHEN 325 MG PO TABS: 650.0000 mg | ORAL_TABLET | Freq: Four times a day (QID) | ORAL | Status: DC | PRN

## 2021-12-16 MED ORDER — MAGNESIUM SULFATE 2 GM/50ML IV SOLN
2.0000 g | Freq: Once | INTRAVENOUS | Status: AC
  Administered 2021-12-16: 2 g via INTRAVENOUS
  Filled 2021-12-16: qty 50

## 2021-12-16 MED ORDER — MORPHINE SULFATE (PF) 4 MG/ML IV SOLN
4.0000 mg | Freq: Once | INTRAVENOUS | Status: AC
  Administered 2021-12-16: 4 mg via INTRAVENOUS
  Filled 2021-12-16: qty 1

## 2021-12-16 MED ORDER — PANTOPRAZOLE SODIUM 40 MG IV SOLR
40.0000 mg | INTRAVENOUS | Status: DC
  Administered 2021-12-16 – 2021-12-20 (×5): 40 mg via INTRAVENOUS
  Filled 2021-12-16 (×5): qty 10

## 2021-12-16 MED ORDER — SODIUM CHLORIDE 0.9 % IV BOLUS
1000.0000 mL | Freq: Once | INTRAVENOUS | Status: AC
  Administered 2021-12-16: 1000 mL via INTRAVENOUS

## 2021-12-16 MED ORDER — SODIUM CHLORIDE (PF) 0.9 % IJ SOLN
INTRAMUSCULAR | Status: AC
  Administered 2021-12-16: 3 mL
  Filled 2021-12-16: qty 50

## 2021-12-16 MED ORDER — ONDANSETRON HCL 4 MG/2ML IJ SOLN
4.0000 mg | Freq: Once | INTRAMUSCULAR | Status: AC
  Administered 2021-12-16: 4 mg via INTRAVENOUS
  Filled 2021-12-16: qty 2

## 2021-12-16 MED ORDER — IPRATROPIUM-ALBUTEROL 0.5-2.5 (3) MG/3ML IN SOLN
3.0000 mL | Freq: Four times a day (QID) | RESPIRATORY_TRACT | Status: DC | PRN
  Administered 2021-12-17: 3 mL via RESPIRATORY_TRACT
  Filled 2021-12-16: qty 3

## 2021-12-16 MED ORDER — MORPHINE SULFATE (PF) 4 MG/ML IV SOLN
4.0000 mg | INTRAVENOUS | Status: DC | PRN
  Administered 2021-12-16 – 2021-12-17 (×5): 4 mg via INTRAVENOUS
  Filled 2021-12-16 (×5): qty 1

## 2021-12-16 MED ORDER — DIATRIZOATE MEGLUMINE & SODIUM 66-10 % PO SOLN
90.0000 mL | Freq: Once | ORAL | Status: AC
  Administered 2021-12-16: 90 mL via NASOGASTRIC
  Filled 2021-12-16: qty 90

## 2021-12-16 MED ORDER — MORPHINE SULFATE (PF) 4 MG/ML IV SOLN
4.0000 mg | INTRAVENOUS | Status: DC | PRN
  Administered 2021-12-16: 4 mg via INTRAVENOUS
  Filled 2021-12-16: qty 1

## 2021-12-16 MED ORDER — POTASSIUM CHLORIDE IN NACL 20-0.9 MEQ/L-% IV SOLN
INTRAVENOUS | Status: DC
  Filled 2021-12-16 (×13): qty 1000

## 2021-12-16 MED ORDER — ONDANSETRON HCL 4 MG PO TABS: 4.0000 mg | ORAL_TABLET | Freq: Four times a day (QID) | ORAL | Status: DC | PRN

## 2021-12-16 MED ORDER — POTASSIUM CHLORIDE 10 MEQ/100ML IV SOLN
10.0000 meq | INTRAVENOUS | Status: AC
  Administered 2021-12-16 (×2): 10 meq via INTRAVENOUS
  Filled 2021-12-16 (×2): qty 100

## 2021-12-16 MED ORDER — ONDANSETRON HCL 4 MG/2ML IJ SOLN
4.0000 mg | Freq: Four times a day (QID) | INTRAMUSCULAR | Status: DC | PRN
  Administered 2021-12-16 – 2021-12-18 (×7): 4 mg via INTRAVENOUS
  Filled 2021-12-16 (×8): qty 2

## 2021-12-16 MED ORDER — ENOXAPARIN SODIUM 40 MG/0.4ML IJ SOSY
40.0000 mg | PREFILLED_SYRINGE | INTRAMUSCULAR | Status: DC
  Administered 2021-12-16 – 2021-12-19 (×4): 40 mg via SUBCUTANEOUS
  Filled 2021-12-16 (×4): qty 0.4

## 2021-12-16 MED ORDER — IOHEXOL 300 MG/ML  SOLN
100.0000 mL | Freq: Once | INTRAMUSCULAR | Status: AC | PRN
  Administered 2021-12-16: 100 mL via INTRAVENOUS

## 2021-12-16 NOTE — ED Provider Notes (Signed)
[facility]-EMERGENCY DEPT Provider Note   CSN: [id number] Arrival date & time: 12/16/21  0556     History  Chief Complaint  Patient presents with   Abdominal Pain    Gordon Turner is a 36 y.o. male.  With a history of HIV, asthma, celiac disease, small bowel obstruction, multiple abdominal surgeries including obstructions who presents to the ED for evaluation of abdominal pain.  He reports the pain started suddenly at 3:00 this morning.  Has associated nausea and vomiting which started yesterday.  States it feels similar to when he has had bowel obstructions in the past.  Describes the pain as generalized, and an intense ache with some shooting pains.  Rates the pain at an 8 out of 10 at rest.  States it woke him up from his sleep.  Denies fevers, chills, hematemesis, diarrhea, melena, hematochezia, dysuria, frequency, urgency.   Abdominal Pain Associated symptoms: nausea and vomiting        Home Medications Prior to Admission medications   Medication Sig Start Date End Date Taking? Authorizing Provider  albuterol (VENTOLIN HFA) 108 (90 Base) MCG/ACT inhaler Inhale 1-2 puffs into the lungs every 6 (six) hours as needed for wheezing or shortness of breath. 05/09/19   Roxy Horseman, PA-C  ciprofloxacin (CIPRO) 500 MG tablet Take 1 tablet (500 mg total) by mouth every 12 (twelve) hours. 10/06/21   Rancour, Jeannett Senior, MD  diphenhydrAMINE (BENADRYL) 25 MG tablet 1 to 2 tablets every 6 hours for itching and swelling Patient taking differently: Take 25 mg by mouth at bedtime as needed for sleep. 01/12/18   Arby Barrette, MD  ipratropium (ATROVENT) 0.02 % nebulizer solution Take 0.5 mg by nebulization every 6 (six) hours as needed for shortness of breath or wheezing. 05/11/20   [provider]  Ipratropium-Albuterol (COMBIVENT RESPIMAT) 20-100 MCG/ACT AERS respimat Inhale 1 puff into the lungs every 6 (six) hours. Patient taking differently: Inhale 1 puff into the  lungs every 6 (six) hours as needed for shortness of breath. 05/09/19   Mickie Bail, NP  ondansetron (ZOFRAN-ODT) 4 MG disintegrating tablet Take 1 tablet (4 mg total) by mouth every 8 (eight) hours as needed for nausea or vomiting. 11/06/21   Claiborne Rigg, NP  triamcinolone ointment (KENALOG) 0.5 % Apply 1 Application topically 2 (two) times daily. 09/30/21   Blanchard Kelch, NP      Allergies    Amoxicillin and Penicillins    Review of Systems   Review of Systems  Gastrointestinal:  Positive for abdominal pain, nausea and vomiting.  All other systems reviewed and are negative.   Physical Exam Updated Vital Signs BP 129/78   Pulse 75   Temp 98.7 F (37.1 C) (Oral)   Resp 19   Ht  (1.753 m)   Wt 77.1 kg   SpO2 99%   BMI 25.10 kg/m  Physical Exam Vitals and nursing note reviewed.  Constitutional:      General: He is in acute distress (Moderate).     Appearance: Normal appearance. He is normal weight. He is not ill-appearing or toxic-appearing.  HENT:     Head: Normocephalic and atraumatic.  Cardiovascular:     Rate and Rhythm: Normal rate and regular rhythm.  Pulmonary:     Effort: Pulmonary effort is normal. No respiratory distress.     Breath sounds: Normal breath sounds.  Abdominal:     General: Abdomen is flat. A surgical scar is present. Bowel sounds are increased.  Palpations: Abdomen is soft.     Tenderness: There is generalized abdominal tenderness. There is no guarding. Negative signs include psoas sign and obturator sign.     Comments: Tympanic to percussion  Musculoskeletal:        General: Normal range of motion.     Cervical back: Neck supple.  Skin:    General: Skin is warm and dry.     Capillary Refill: Capillary refill takes less than 2 seconds.  Neurological:     Mental Status: He is alert and oriented to person, place, and time.  Psychiatric:        Mood and Affect: Mood normal.        Behavior: Behavior normal.     ED Results /  Procedures / Treatments   Labs (all labs ordered are listed, but only abnormal results are displayed) Labs Reviewed  COMPREHENSIVE METABOLIC PANEL - Abnormal; Notable for the following components:      Result Value   Potassium 3.4 (*)    Glucose, Bld 116 (*)    Total Protein 8.2 (*)    All other components within normal limits  LIPASE, BLOOD  CBC WITH DIFFERENTIAL/PLATELET  URINALYSIS, ROUTINE W REFLEX MICROSCOPIC  LACTIC ACID, PLASMA  LACTIC ACID, PLASMA    EKG EKG Interpretation  Date/Time:  Thursday December 16 2021 08:41:57 EDT Ventricular Rate:  71 PR Interval:  143 QRS Duration: 88 QT Interval:  379 QTC Calculation: 412 R Axis:   88 Text Interpretation: Sinus rhythm Probable left ventricular hypertrophy Baseline wander in lead(s) II III aVF V3 V4 Confirmed by Garnette Gunner [phone]) on 12/16/2021 8:48:56 AM  Radiology CT Abdomen Pelvis W Contrast  Result Date: 12/16/2021 CLINICAL DATA:  A 36 year old presents with suspected bowel obstruction in the setting of abdominal pain. History obtained in care everywhere discusses history of multiple bowel surgeries as an infant. EXAM: CT ABDOMEN AND PELVIS WITH CONTRAST TECHNIQUE: Multidetector CT imaging of the abdomen and pelvis was performed using the standard protocol following bolus administration of intravenous contrast. RADIATION DOSE REDUCTION: This exam was performed according to the departmental dose-optimization program which includes automated exposure control, adjustment of the mA and/or kV according to patient size and/or use of iterative reconstruction technique. CONTRAST:  142mL OMNIPAQUE IOHEXOL 300 MG/ML  SOLN COMPARISON:  October 14, 2021. FINDINGS: Lower chest: Incidental imaging of the lung bases is unremarkable. Cardiac structures on limited assessment without acute process. Hepatobiliary: No focal, suspicious hepatic lesion. No pericholecystic stranding. No biliary duct dilation. Portal vein is patent. 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 Background hepatic steatosis. Pancreas: Normal, without mass, inflammation or ductal dilatation. Spleen: Normal. Adrenals/Urinary Tract: Adrenal glands are unremarkable. Symmetric renal enhancement. No sign of hydronephrosis. No suspicious renal lesion or perinephric stranding. Urinary bladder is grossly unremarkable. 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 Stomach/Bowel: Small hiatal hernia. No stranding adjacent to the stomach. No stranding adjacent to the jejunum which crosses back across the midline and and back into the LEFT abdomen as on the previous study. There is foreshortening of the colon such that ileocecal resection is suspected with ileocolonic anastomosis in the RIGHT upper quadrant. Pattern of bowel dilation is similar to the exam from January of 2020. Bowel within the mid and distal small bowel is however more distended than on previous imaging. In the distal jejunum there is either a pseudo sacculation or a large jejunal diverticulum. There is no  surrounding stranding. Proximal transition point occurring in the mid abdomen. The transition to dilated in non dilated bowel is more gradual in the proximal transition where jejunum goes from nondilated to dilated bowel in the central abdomen. Moderate to marked dilation throughout the distal jejunum extending into the proximal ileum. Distorted loop of bowel in the RIGHT hemiabdomen displays mural stratification with thickening of the wall. There are small areas of interloop fluid. There is no sign of perforation or abscess. The transition along the distal bowel occurs in the RIGHT paramidline central abdomen best seen on image 44/5. This is upstream from the suspected ileocolonic anastomotic site. The colon is under distended and or collapsed throughout its course. Scattered loops of fluid-filled bowel are seen beyond the site of bowel  transition in the RIGHT abdomen. Stool like material is seen just proximal to the transition in the RIGHT mid abdomen. Another jejunal diverticulum is demonstrated on image 40/2. This well-circumscribed collection of gas extending into the mesentery without adjacent stranding from the jejunum is unchanged compared to imaging from March 31, 2018. Vascular/Lymphatic: Patent abdominal vessels. No signs of aneurysmal dilation of the abdominal aorta. No adenopathy in the abdomen or in the pelvis. Reproductive: Moderate prostatomegaly with heterogeneous appearance is nonspecific and unchanged. Other: No pneumatosis, free air or portal venous gas. Small amounts of interloop fluid in the RIGHT lower quadrant about dilated bowel loops. The site of transition is identical to the study of March 31, 2018 but with more distension of bowel loops on today's study reaching maximal caliber near 3.4 cm as compared to 2.8 cm. 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 No destructive bone process or acute bone finding otherwise. IMPRESSION: 1. Signs of bowel obstruction involving the mid ileum for site of transition. Site of transition is at the same site that was seen on the study of January of 2020 but is associated with increased bowel dilation. Interloop fluid seen on today's study is similar to the prior exam and can be seen in the setting of enteritis but is nonspecific. Correlation with lactate is suggested. 2. Findings above may be due to functional obstruction from enteritis or obstruction due to complex adhesions. Given distortion of bowel and proximal transition of bowel dilation in the central abdomen internal hernia is also considered but findings are again very similar to the study of January of 2020. Ultimately surgical consultation and lactate trend may be helpful for further management. 3. 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 Reportedly the patient had multiple surgeries as an infant or child. Would also correlate with any history of inflammatory bowel disease given surgical alterations seen above 4. Jejunal diverticulosis. 5. Prostatomegaly and heterogeneity similar to prior imaging. 6. These results were called by telephone at the time of interpretation on 12/16/2021 at 9:12 am to provider [facility] Kestrel Mis , who verbally acknowledged these results. Electronically Signed   By: Donzetta KohutGeoffrey  Wile M.D.   On: 12/16/2021 09:12    Procedures Procedures    Medications Ordered in ED Medications  sodium chloride (PF) 0.9 % injection (has no administration in time range)  sodium chloride 0.9 % bolus 1,000 mL (0 mLs Intravenous Stopped 12/16/21 0918)  ondansetron (ZOFRAN) injection 4 mg (4 mg Intravenous Given 12/16/21 0653)  morphine (PF) 4 MG/ML injection 4 mg (4 mg Intravenous Given 12/16/21 0727)  iohexol (OMNIPAQUE) 300 MG/ML solution 100 mL (100 mLs Intravenous Contrast Given 12/16/21 0825)  sodium chloride  0.9 % bolus 1,000 mL (1,000 mLs Intravenous New Bag/Given 12/16/21 0920)    ED Course/ Medical Decision Making/ A&P Clinical Course as of 12/16/21 1027  Thu Dec 16, 2021  0817 After reevaluation patient states he has no more nausea, abdominal pain is improved but still a 6 out of 10. [AS]  [id number] Spoke with radiology, patient does have significant abdominal surgical history with multiple bowel resections and unique anatomy.  Patient has possible functional obstruction due to enteritis, however internal hernias are still in the differential as well as adhesions. [AS]  [id number] Request general surgery Dr.Maczis, he recommends admission to medicine, pain control, nausea control, NG tube placement [AS]  1009 Spoke with hospitalist Dr. Robb Matar.  He would admit for small bowel obstruction [AS]    Clinical Course User Index [AS] Kimia Finan, Edsel Petrin, PA-C                           Medical Decision Making Amount and/or  Complexity of Data Reviewed Labs: ordered. Radiology: ordered.  Risk Prescription drug management. Decision regarding hospitalization.  This patient presents to the ED for concern of abdominal pain, this involves an extensive number of treatment options, and is a complaint that carries with it a high risk of complications and morbidity.  The differential diagnosis for generalized abdominal pain includes, but is not limited to AAA, gastroenteritis, appendicitis, Bowel obstruction, Bowel perforation. Gastroparesis, DKA, Hernia, Inflammatory bowel disease, mesenteric ischemia, pancreatitis, peritonitis SBP, volvulus.    Co morbidities that complicate the patient evaluation  History of bowel obstructions, extensive abdominal surgical history, HIV  My initial workup includes abdominal pain labs, EKG, CT abdomen pelvis, fluids, Zofran, morphine  Additional history obtained from: Nursing notes from this visit. Prior ED visit on 10/06/2021 for evaluation of abdominal pain Previous records within EMR system evaluation of abdominal pain on 11/06/2021  I ordered, reviewed and interpreted labs which include: CBC, CMP, lipase, urinalysis, lactate.  All labs within normal limits   I ordered imaging studies including CT abdomen pelvis with contrast I independently visualized and interpreted imaging which showed unique intestinal anatomy, functional bowel obstruction possibly caused by enteritis versus adhesions versus internal hernia as.  Jejunal diverticulosis.  Please see radiologist interpretation for further details. I agree with the radiologist interpretation  Cardiac Monitoring:  The patient was maintained on a cardiac monitor.  I personally viewed and interpreted the cardiac monitored which showed an underlying rhythm of: NSR  Consultations Obtained:  I requested consultation with the general surgery Dr. Lavell Anchors,  and discussed lab and imaging findings as well as pertinent plan - they  recommend: Nausea and pain control, NG tube, admit to medicine  Spoke with hospitalist Dr. Robb Matar.  He will admit for small bowel obstruction.  Afebrile, hemodynamically stable.  Patient is a 36 year old male with a history of HIV, premature birth, multiple bowel surgeries who presents to the ED for evaluation of abdominal pain.  Pain is similar to previous episodes of bowel obstruction.  Physical exam shows diffuse tenderness to the abdomen and tympany to percussion. Patient was started on fluids, IV Zofran and IV morphine.  CT abdomen shows bowel obstruction.  General surgery contacted and will consult.  Patient admitted to hospitalist.  NG tube started.  Dilaudid given for pain.  Patient has remained without nausea since initial dose of Zofran.  Patient's case discussed with Dr. Suezanne Jacquet who agrees with plan to admit to medicine for bowel obstruction with general surgery  consultation.  Note: Portions of this report may have been transcribed using voice recognition software. Every effort was made to ensure accuracy; however, inadvertent computerized transcription errors may still be present.          Final Clinical Impression(s) / ED Diagnoses Final diagnoses:  None    Rx / DC Orders ED Discharge Orders     None         Michelle Piper, Cordelia Poche 12/16/21 1455    Lonell Grandchild, MD 12/16/21 [phone]

## 2021-12-16 NOTE — H&P (Signed)
History and Physical    Patient: Gordon Turner [id number] DOB: 1986-02-11 DOA: 12/16/2021 DOS: the patient was seen and examined on 12/16/2021 PCP: [place] Callas, NP  Patient coming from: Home  Chief Complaint:  Chief Complaint  Patient presents with   Abdominal Pain   HPI: Gordon Turner is a 36 y.o. male with medical history significant of asthma, HIV infection, multiple episodes of SBO who is coming to the emergency department with abdominal pain that woke him up from sleep around 0300 associated with nausea and multiple episodes of vomiting similar to the symptoms he develops with SBO.  He has been constipated for the past 2 days.  He denied fever, chills, rhinorrhea, sore throat, wheezing or hemoptysis.  No chest pain, palpitations, diaphoresis, PND, orthopnea or pitting edema of the lower extremities.  No abdominal pain, nausea, emesis, diarrhea, melena or hematochezia.  No flank pain, dysuria, frequency or hematuria.  No polyuria, polydipsia, polyphagia or blurred vision.   ED course: Initial vital signs were temperature 98.3 F, pulse 75, respiration 17, BP 134/70 mmHg O2 sat 100% on room air.  The patient received morphine 4 mg IVP, ondansetron 4 mg IVP and 2000 mL of normal saline bolus.  Lab work: His urinalysis showed increased specific gravity, but was otherwise unremarkable.  CBC, lactic acid, magnesium and lipase were normal.  CMP shows a potassium 3.4 mmol/L, glucose 160 mg/dL and total protein 8.2 g/dL.  The rest of the CMP was unremarkable.  Imaging: CT abdomen/pelvis with contrast SBO involving the mid ileum for Sital transition.  Signs of ileocecal resection with altered bowel anatomy including right where deviation of the jejunum.  We will also correlate with any history inflammatory bowel disease.  There is jejunal diverticulosis.  Prostatomegaly and heterogeneity similar to prior imaging.  Review of Systems: As mentioned in the history of present illness. All other  systems reviewed and are negative.  Past Medical History:  Diagnosis Date   Asthma    HIV (human immunodeficiency virus infection) ([place])    History reviewed. No pertinent surgical history. Social History:  reports that he has never smoked. He has never used smokeless tobacco. He reports current alcohol use. He reports that he does not currently use drugs after having used the following drugs: Marijuana.  Allergies  Allergen Reactions   Amoxicillin Anaphylaxis   Penicillins Anaphylaxis, Hives, Swelling and Other (See Comments)    DID THE REACTION INVOLVE: Swelling of the face/tongue/throat, SOB, or low BP? No Sudden or severe rash/hives, skin peeling, or the inside of the mouth or nose? No Did it require medical treatment? No When did it last happen?     baby If all above answers are "NO", may proceed with cephalosporin use.    History reviewed. No pertinent family history.  Prior to Admission medications   Medication Sig Start Date End Date Taking? Authorizing Provider  albuterol (VENTOLIN HFA) 108 (90 Base) MCG/ACT inhaler Inhale 1-2 puffs into the lungs every 6 (six) hours as needed for wheezing or shortness of breath. 05/09/19   Montine Circle, PA-C  ciprofloxacin (CIPRO) 500 MG tablet Take 1 tablet (500 mg total) by mouth every 12 (twelve) hours. 10/06/21   Rancour, Annie Main, MD  diphenhydrAMINE (BENADRYL) 25 MG tablet 1 to 2 tablets every 6 hours for itching and swelling Patient taking differently: Take 25 mg by mouth at bedtime as needed for sleep. 01/12/18   Charlesetta Shanks, MD  ipratropium (ATROVENT) 0.02 % nebulizer solution Take 0.5 mg by nebulization every  6 (six) hours as needed for shortness of breath or wheezing. 05/11/20   [provider]  Ipratropium-Albuterol (COMBIVENT RESPIMAT) 20-100 MCG/ACT AERS respimat Inhale 1 puff into the lungs every 6 (six) hours. Patient taking differently: Inhale 1 puff into the lungs every 6 (six) hours as needed for shortness of  breath. 05/09/19   Sharion Balloon, NP  ondansetron (ZOFRAN-ODT) 4 MG disintegrating tablet Take 1 tablet (4 mg total) by mouth every 8 (eight) hours as needed for nausea or vomiting. 11/06/21   [name], NP  triamcinolone ointment (KENALOG) 0.5 % Apply 1 Application topically 2 (two) times daily. 09/30/21   Gladstone Callas, NP    Physical Exam: Vitals:   12/16/21 0815 12/16/21 0925 12/16/21 1100 12/16/21 1119  BP: 127/84 129/78 (!) 135/94   Pulse: 73 75 64   Resp: 14 19 20    Temp:    98.4 F (36.9 C)  TempSrc:    Oral  SpO2: 99% 99% 96%   Weight:      Height:       Physical Exam Vitals and nursing note reviewed.  Constitutional:      General: He is awake. He is not in acute distress.    Appearance: He is well-developed.  HENT:     Head: Normocephalic.     Nose: No rhinorrhea.     Mouth/Throat:     Mouth: Mucous membranes are dry.  Eyes:     General: No scleral icterus.    Pupils: Pupils are equal, round, and reactive to light.  Neck:     Vascular: No JVD.  Cardiovascular:     Rate and Rhythm: Normal rate and regular rhythm.     Heart sounds: S1 normal and S2 normal.  Pulmonary:     Effort: Pulmonary effort is normal.     Breath sounds: Normal breath sounds.  Abdominal:     General: Bowel sounds are normal. There is no distension.     Palpations: Abdomen is soft.     Tenderness: There is abdominal tenderness. There is left CVA tenderness. There is no right CVA tenderness, guarding or rebound.  Musculoskeletal:     Cervical back: Neck supple.     Right lower leg: No edema.     Left lower leg: No edema.  Skin:    General: Skin is warm and dry.  Neurological:     General: No focal deficit present.     Mental Status: He is alert and oriented to person, place, and time.  Psychiatric:        Mood and Affect: Mood normal.        Behavior: Behavior normal. Behavior is cooperative.     Data Reviewed:  There are no new results to review at this  time.  Assessment and Plan: Principal Problem:   SBO (small bowel obstruction) (HCC) Observation/MedSurg. Keep NPO. Continue IV fluids. Analgesics as needed. Antiemetics as needed. Pantoprazole 40 mg IVP every 24 hours. Keep electrolytes optimized. Follow-up CBC and CMP in AM. Follow-up imaging in the morning. General surgery input appreciated.  Active Problems:   Volume depletion Continue IV fluids.    Hypokalemia Replacing. Magnesium was supplemented. Follow-up potassium level in AM.    Human immunodeficiency virus (HIV) disease ([place]) Not on antiretrovirals.    Moderate persistent asthma with exacerbation Bronchodilators as needed. Supplemental oxygen as as needed.     Advance Care Planning:   Code Status: Full Code   Consults: General surgery.  Family Communication:  Severity of Illness: The appropriate patient status for this patient is INPATIENT. Inpatient status is judged to be reasonable and necessary in order to provide the required intensity of service to ensure the patient's safety. The patient's presenting symptoms, physical exam findings, and initial radiographic and laboratory data in the context of their chronic comorbidities is felt to place them at high risk for further clinical deterioration. Furthermore, it is not anticipated that the patient will be medically stable for discharge from the hospital within 2 midnights of admission.   * I certify that at the point of admission it is my clinical judgment that the patient will require inpatient hospital care spanning beyond 2 midnights from the point of admission due to high intensity of service, high risk for further deterioration and high frequency of surveillance required.*  Author: Reubin Milan, MD 12/16/2021 11:51 AM  For on call review [email or web address].   This document was prepared using Dragon voice recognition software and may contain some unintended transcription errors.

## 2021-12-16 NOTE — ED Triage Notes (Addendum)
Pt here with chronic ABD pain- Last BM yesterday. 2 episodes of emesis. Hx of ED visits for such.

## 2021-12-16 NOTE — Consult Note (Signed)
Gordon Turner 1985-08-10  [id number].    Requesting MD: Michelle PiperSchutt, Alexander M, PA-C Chief Complaint/Reason for Consult: SBO  HPI: Gordon Turner is a 36 y.o. male with a hx of HIV (followed by ID, compliant on Biktarvy, planning to switch to Guineaabenuva injection, last CD4 290 on 09/22/21), multiple prior abdominal surgeries and prior SBO who presented to the ED for abdominal pain, nausea and vomiting.  Patient reports he normally has 2-3 loose BMs daily.  Last BM was on Tuesday.  He was passing flatus yesterday.  He went to sleep feeling fine but woke up in the middle of the night with intense/severe LLQ abdominal pain with associated nausea and vomiting.  Reports this feels exactly like his prior SBO in 2020 prompting his visit to the ED.  In the ED he was afebrile.  Initially tachycardic at 105 which has resolved and now with heart rate in the 60s.  No hypotension.  WBC 6.5.  Lactic acid within normal limits.  No AKI.  CT A/P with SBO involving the mid ileum similar to SBO in 2020.  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  When he was still an infant he reports he had ileostomy reversed.  Has not had any prior surgeries since that time.  His SBO in 2020 resolved with conservative management.  NG tube was placed in the ED.  He reports minimal improvement after NGT. Still with some LLQ abdominal pain.   ROS: ROS  History reviewed. No pertinent family history.  Past Medical History:  Diagnosis Date   Asthma    HIV (human immunodeficiency virus infection) (HCC)     History reviewed. No pertinent surgical history.  Social History:  reports that he has never smoked. He has never used smokeless tobacco. He reports current alcohol use. He reports that he does not currently use drugs after having used the following drugs: Marijuana. Vapes Smokes marijuana.  No other drug use Social alcohol use  Allergies:  Allergies  Allergen Reactions    Amoxicillin Anaphylaxis   Penicillins Anaphylaxis, Hives, Swelling and Other (See Comments)    DID THE REACTION INVOLVE: Swelling of the face/tongue/throat, SOB, or low BP? No Sudden or severe rash/hives, skin peeling, or the inside of the mouth or nose? No Did it require medical treatment? No When did it last happen?     baby If all above answers are "NO", may proceed with cephalosporin use.    (Not in a hospital admission)    Physical Exam: Blood pressure (!) 135/94, pulse 64, temperature 98.4 F (36.9 C), temperature source Oral, resp. rate 20, height 5\' 9"  (1.753 m), weight 77.1 kg, SpO2 96 %. General: pleasant, WD/WN male who is laying in bed in NAD HEENT: head is normocephalic, atraumatic.  Sclera are noninjected.  PERRL.  Ears and nose without any masses or lesions.  Mouth is pink and moist. Dentition fair Heart: regular, rate, and rhythm.  Lungs: CTAB, no wheezes, rhonchi, or rales noted.  Respiratory effort nonlabored Abd: Soft, mild distension, mainly left sided ttp greatest in the LLQ without rigidity or rebound, hypoactive bs, NGT in place on LIWS. Prior R surgical scar well healed.  MS: no BUE/BLE edema Skin: warm and dry  Psych: A&Ox4 with an appropriate affect Neuro: cranial nerves grossly intact, normal speech, thought process intact, moves all extremities, gait not assessed  Results for orders placed or performed during the hospital encounter of 12/16/21 (from  the past 48 hour(s))  Comprehensive metabolic panel     Status: Abnormal   Collection Time: 12/16/21  6:47 AM  Result Value Ref Range   Sodium 140 135 - 145 mmol/L   Potassium 3.4 (L) 3.5 - 5.1 mmol/L   Chloride 110 98 - 111 mmol/L   CO2 22 22 - 32 mmol/L   Glucose, Bld 116 (H) 70 - 99 mg/dL    Comment: Glucose reference range applies only to samples taken after fasting for at least 8 hours.   BUN 14 6 - 20 mg/dL   Creatinine, Ser 1.61 0.61 - 1.24 mg/dL   Calcium 9.4 8.9 - 09.6 mg/dL   Total Protein 8.2  (H) 6.5 - 8.1 g/dL   Albumin 4.2 3.5 - 5.0 g/dL   AST 25 15 - 41 U/L   ALT 22 0 - 44 U/L   Alkaline Phosphatase 74 38 - 126 U/L   Total Bilirubin 0.6 0.3 - 1.2 mg/dL   GFR, Estimated >04 >54 mL/min    Comment: (NOTE) Calculated using the CKD-EPI Creatinine Equation (2021)    Anion gap 8 5 - 15    Comment: Performed at [facility], [address], [place], [place] [address]  Lipase, blood     Status: None   Collection Time: 12/16/21  6:47 AM  Result Value Ref Range   Lipase 26 11 - 51 U/L    Comment: Performed at [facility], [address], [place], [place] [address]  CBC with Diff     Status: None   Collection Time: 12/16/21  6:47 AM  Result Value Ref Range   WBC 6.5 4.0 - 10.5 K/uL   RBC 5.36 4.22 - 5.81 MIL/uL   Hemoglobin 15.7 13.0 - 17.0 g/dL   HCT 29.5 62.1 - 30.8 %   MCV 86.4 80.0 - 100.0 fL   MCH 29.3 26.0 - 34.0 pg   MCHC 33.9 30.0 - 36.0 g/dL   RDW 65.7 84.6 - 96.2 %   Platelets 267 150 - 400 K/uL   nRBC 0.0 0.0 - 0.2 %   Neutrophils Relative % 69 %   Neutro Abs 4.5 1.7 - 7.7 K/uL   Lymphocytes Relative 23 %   Lymphs Abs 1.5 0.7 - 4.0 K/uL   Monocytes Relative 7 %   Monocytes Absolute 0.4 0.1 - 1.0 K/uL   Eosinophils Relative 1 %   Eosinophils Absolute 0.1 0.0 - 0.5 K/uL   Basophils Relative 0 %   Basophils Absolute 0.0 0.0 - 0.1 K/uL   Immature Granulocytes 0 %   Abs Immature Granulocytes 0.02 0.00 - 0.07 K/uL    Comment: Performed at [facility], [address] [place], [place] [address]  Magnesium     Status: None   Collection Time: 12/16/21  6:47 AM  Result Value Ref Range   Magnesium 2.1 1.7 - 2.4 mg/dL    Comment: Performed at [facility], [address] [place], [place] [address]  Lactic acid, plasma     Status: None   Collection Time: 12/16/21  9:11 AM  Result Value Ref Range   Lactic Acid, Venous 1.0 0.5 - 1.9 mmol/L    Comment: Performed at [facility], [address] [place], [place] [address]  Urinalysis, Routine w reflex microscopic Urine, Clean Catch     Status: Abnormal   Collection Time: 12/16/21  9:31 AM  Result Value Ref Range   Color, Urine  YELLOW YELLOW   APPearance CLEAR CLEAR   Specific Gravity, Urine 1.042 (H) 1.005 - 1.030   pH 6.0 5.0 - 8.0   Glucose, UA NEGATIVE NEGATIVE mg/dL   Hgb urine dipstick NEGATIVE NEGATIVE   Bilirubin Urine NEGATIVE NEGATIVE   Ketones, ur NEGATIVE NEGATIVE mg/dL   Protein, ur NEGATIVE NEGATIVE mg/dL   Nitrite NEGATIVE NEGATIVE   Leukocytes,Ua NEGATIVE NEGATIVE    Comment: Performed at [facility] [address] [place], [place] [address]   DG Abd Portable 1V  Result Date: 12/16/2021 CLINICAL DATA:  Chronic abdominal pain EXAM: PORTABLE ABDOMEN - 1 VIEW COMPARISON:  04/01/2018.  CT today. FINDINGS: NG tube tip is in the proximal stomach with the side port in the distal esophagus. Mildly dilated small bowel loops centrally. No organomegaly or suspicious calcification. IMPRESSION: NG tube tip in the proximal stomach. Evidence of small-bowel obstruction. Electronically Signed   By: Rolm Baptise M.D.   On: 12/16/2021 10:37   CT Abdomen Pelvis W Contrast  Result Date: 12/16/2021 CLINICAL DATA:  A 36 year old presents with suspected bowel obstruction in the setting of abdominal pain. History obtained in care everywhere discusses history of multiple bowel surgeries as an infant. EXAM: CT ABDOMEN AND PELVIS WITH CONTRAST TECHNIQUE: Multidetector CT imaging of the abdomen and pelvis was performed using the standard protocol following bolus administration of intravenous contrast. RADIATION DOSE REDUCTION: This exam was performed according to the departmental dose-optimization program which includes automated exposure control, adjustment of the mA and/or kV according to patient size and/or use of iterative reconstruction technique. CONTRAST:  122mL OMNIPAQUE IOHEXOL 300 MG/ML   SOLN COMPARISON:  October 14, 2021. FINDINGS: Lower chest: Incidental imaging of the lung bases is unremarkable. Cardiac structures on limited assessment without acute process. Hepatobiliary: No focal, suspicious hepatic lesion. No pericholecystic stranding. No biliary duct dilation. Portal vein is patent. Hepatic hemangioma in the LEFT hepatic lobe is unchanged. Background hepatic steatosis. Pancreas: Normal, without mass, inflammation or ductal dilatation. Spleen: Normal. Adrenals/Urinary Tract: Adrenal glands are unremarkable. Symmetric renal enhancement. No sign of hydronephrosis. No suspicious renal lesion or perinephric stranding. Urinary bladder is grossly unremarkable. 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 Stomach/Bowel: Small hiatal hernia. No stranding adjacent to the stomach. No stranding adjacent to the jejunum which crosses back across the midline and and back into the LEFT abdomen as on the previous study. There is foreshortening of the colon such that ileocecal resection is suspected with ileocolonic anastomosis in the RIGHT upper quadrant. Pattern of bowel dilation is similar to the exam from January of 2020. Bowel within the mid and distal small bowel is however more distended than on previous imaging. In the distal jejunum there is either a pseudo sacculation or a large jejunal diverticulum. There is no surrounding stranding. Proximal transition point occurring in the mid abdomen. The transition to dilated in non dilated bowel is more gradual in the proximal transition where jejunum goes from nondilated to dilated bowel in the central abdomen. Moderate to marked dilation throughout the distal jejunum extending into the proximal ileum. Distorted loop of bowel in the RIGHT hemiabdomen displays mural stratification with thickening of the wall. There are small areas of interloop fluid. There is no sign of perforation or abscess. The transition  along the distal bowel occurs in the RIGHT paramidline central abdomen best seen on image 44/5. This is upstream from the suspected ileocolonic anastomotic site. The colon is under  distended and or collapsed throughout its course. Scattered loops of fluid-filled bowel are seen beyond the site of bowel transition in the RIGHT abdomen. Stool like material is seen just proximal to the transition in the RIGHT mid abdomen. Another jejunal diverticulum is demonstrated on image 40/2. This well-circumscribed collection of gas extending into the mesentery without adjacent stranding from the jejunum is unchanged compared to imaging from March 31, 2018. Vascular/Lymphatic: Patent abdominal vessels. No signs of aneurysmal dilation of the abdominal aorta. No adenopathy in the abdomen or in the pelvis. Reproductive: Moderate prostatomegaly with heterogeneous appearance is nonspecific and unchanged. Other: No pneumatosis, free air or portal venous gas. Small amounts of interloop fluid in the RIGHT lower quadrant about dilated bowel loops. The site of transition is identical to the study of March 31, 2018 but with more distension of bowel loops on today's study reaching maximal caliber near 3.4 cm as compared to 2.8 cm. 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 No destructive bone process or acute bone finding otherwise. IMPRESSION: 1. Signs of bowel obstruction involving the mid ileum for site of transition. Site of transition is at the same site that was seen on the study of January of 2020 but is associated with increased bowel dilation. Interloop fluid seen on today's study is similar to the prior exam and can be seen in the setting of enteritis but is nonspecific. Correlation with lactate is suggested. 2. Findings above may be due to functional obstruction from enteritis or obstruction due to complex adhesions. Given distortion of bowel and proximal transition of bowel dilation in  the central abdomen internal hernia is also considered but findings are again very similar to the study of January of 2020. Ultimately surgical consultation and lactate trend may be helpful for further management. 3. 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 Reportedly the patient had multiple surgeries as an infant or child. Would also correlate with any history of inflammatory bowel disease given surgical alterations seen above 4. Jejunal diverticulosis. 5. Prostatomegaly and heterogeneity similar to prior imaging. 6. These results were called by telephone at the time of interpretation on 12/16/2021 at 9:12 am to provider [facility] , who verbally acknowledged these results. Electronically Signed   By: Donzetta Kohut M.D.   On: 12/16/2021 09:12    Anti-infectives (From admission, onward)    None       Assessment/Plan SBO - CT w/ SBO. There was a question of possible internal hernia by radiology on CT. I reviewed imaging personally and with my attending. He is currently HDS without fever, tachycardia or hypotension. WBC and lactic wnl. No peritonitis on exam. I do not think he needs emergency surgery at this time. He has hx of prior abdominal surgeries as an infant so very likely this could be 2/2 adhesions. Will monitor closely.  - Cont NGT for decompression and keep NPO - Start SBO protocol - Keep K > 4 and Mg > 2 for bowel function - Mobilize for bowel function - Hopefully patient will improve with conservative management. If patient fails to improve with conservative management or was to acutely worsen, he may require exploratory surgery during admission - Agree with medical admission. We will follow with you.   FEN - NPO, IVF per [facility] VTE - SCDs, okay for chemical ppx from a general surgery standpoint ID - None indicated from our standpoint Foley - None, voiding Dispo - Admit to [facility].  HIV  I reviewed nursing notes, ED  provider notes, hospitalist notes, last 24 h vitals and pain scores, last 48 h intake and output, last 24 h labs and trends, and last 24 h imaging results.   Jacinto Halim, [facility] Surgery 12/16/2021, 11:46 AM Please see Amion for pager number during day hours 7:00am-4:30pm

## 2021-12-17 ENCOUNTER — Inpatient Hospital Stay (HOSPITAL_COMMUNITY): Payer: BC Managed Care – PPO

## 2021-12-17 DIAGNOSIS — K56609 Unspecified intestinal obstruction, unspecified as to partial versus complete obstruction: Secondary | ICD-10-CM | POA: Diagnosis not present

## 2021-12-17 LAB — CBC
HCT: 42.9 % (ref 39.0–52.0)
Hemoglobin: 14.3 g/dL (ref 13.0–17.0)
MCH: 29.5 pg (ref 26.0–34.0)
MCHC: 33.3 g/dL (ref 30.0–36.0)
MCV: 88.5 fL (ref 80.0–100.0)
Platelets: 230 10*3/uL (ref 150–400)
RBC: 4.85 MIL/uL (ref 4.22–5.81)
RDW: 12.9 % (ref 11.5–15.5)
WBC: 5.4 10*3/uL (ref 4.0–10.5)
nRBC: 0 % (ref 0.0–0.2)

## 2021-12-17 LAB — COMPREHENSIVE METABOLIC PANEL
ALT: 17 U/L (ref 0–44)
AST: 18 U/L (ref 15–41)
Albumin: 3.4 g/dL — ABNORMAL LOW (ref 3.5–5.0)
Alkaline Phosphatase: 65 U/L (ref 38–126)
Anion gap: 6 (ref 5–15)
BUN: 9 mg/dL (ref 6–20)
CO2: 21 mmol/L — ABNORMAL LOW (ref 22–32)
Calcium: 8.2 mg/dL — ABNORMAL LOW (ref 8.9–10.3)
Chloride: 111 mmol/L (ref 98–111)
Creatinine, Ser: 0.7 mg/dL (ref 0.61–1.24)
GFR, Estimated: >60 mL/min (ref >60)
Glucose, Bld: 105 mg/dL — ABNORMAL HIGH (ref 70–99)
Potassium: 3.9 mmol/L (ref 3.5–5.1)
Sodium: 138 mmol/L (ref 135–145)
Total Bilirubin: 0.9 mg/dL (ref 0.3–1.2)
Total Protein: 7.1 g/dL (ref 6.5–8.1)

## 2021-12-17 MED ORDER — MORPHINE SULFATE (PF) 2 MG/ML IV SOLN
2.0000 mg | INTRAVENOUS | Status: DC | PRN
  Administered 2021-12-17 (×3): 2 mg via INTRAVENOUS
  Administered 2021-12-18: 4 mg via INTRAVENOUS
  Administered 2021-12-18: 2 mg via INTRAVENOUS
  Administered 2021-12-18: 4 mg via INTRAVENOUS
  Administered 2021-12-18: 2 mg via INTRAVENOUS
  Administered 2021-12-18: 4 mg via INTRAVENOUS
  Administered 2021-12-18: 2 mg via INTRAVENOUS
  Administered 2021-12-18 – 2021-12-20 (×5): 4 mg via INTRAVENOUS
  Filled 2021-12-17: qty 1
  Filled 2021-12-17: qty 2
  Filled 2021-12-17: qty 1
  Filled 2021-12-17: qty 2
  Filled 2021-12-17: qty 1
  Filled 2021-12-17: qty 2
  Filled 2021-12-17: qty 1
  Filled 2021-12-17 (×4): qty 2
  Filled 2021-12-17 (×4): qty 1
  Filled 2021-12-17: qty 2

## 2021-12-17 MED ORDER — DIATRIZOATE MEGLUMINE & SODIUM 66-10 % PO SOLN
90.0000 mL | Freq: Once | ORAL | Status: AC
  Administered 2021-12-17: 90 mL via NASOGASTRIC
  Filled 2021-12-17: qty 90

## 2021-12-17 MED ORDER — PROCHLORPERAZINE EDISYLATE 10 MG/2ML IJ SOLN
5.0000 mg | Freq: Once | INTRAMUSCULAR | Status: AC
  Administered 2021-12-17: 5 mg via INTRAVENOUS
  Filled 2021-12-17: qty 2

## 2021-12-17 MED ORDER — PHENOL 1.4 % MT LIQD
1.0000 | OROMUCOSAL | Status: DC | PRN
  Filled 2021-12-17: qty 177

## 2021-12-17 MED ORDER — HYDROMORPHONE HCL 1 MG/ML IJ SOLN
0.5000 mg | INTRAMUSCULAR | Status: DC | PRN
  Administered 2021-12-17 (×2): 1 mg via INTRAVENOUS
  Filled 2021-12-17 (×2): qty 1

## 2021-12-17 NOTE — Progress Notes (Signed)
Subjective: CC: Less pain, bloating and nausea since ngt placement. Still with some llq pain. No flatus or bm. Mobilizing. NGT with ~600cc output. Xray with continued small bowel dilation, no contrast appreciated.   Objective: Vital signs in last 24 hours: Temp:  [98 F (36.7 C)-99.3 F (37.4 C)] 99.2 F (37.3 C) (09/22 0616) Pulse Rate:  [62-92] 78 (09/22 0616) Resp:  [15-20] 16 (09/22 0616) BP: (122-135)/(69-94) 122/69 (09/22 0616) SpO2:  [96 %-100 %] 96 % (09/22 0616) Last BM Date : 12/14/21  Intake/Output from previous day: 09/21 0701 - 09/22 0700 In: 3707.3 [I.V.:1480.8; IV Piggyback:2226.5] Out: 1275 [Urine:700; Emesis/NG output:575] Intake/Output this shift: No intake/output data recorded.  PE: Gen:  Alert, NAD, pleasant Abd: Soft, mild distension, mainly left sided ttp greatest in the LLQ without rigidity or rebound, hypoactive bs, NGT in place on LIWS. Prior R surgical scar well healed. Hx of R inguinal hernia per prior notes that appears reduced on exam. No other obvious hernia's.   Lab Results:  Recent Labs    12/16/21 0647 12/17/21 0446  WBC 6.5 5.4  HGB 15.7 14.3  HCT 46.3 42.9  PLT 267 230   BMET Recent Labs    12/16/21 0647 12/17/21 0446  NA 140 138  K 3.4* 3.9  CL 110 111  CO2 22 21*  GLUCOSE 116* 105*  BUN 14 9  CREATININE 0.80 0.70  CALCIUM 9.4 8.2*   PT/INR No results for input(s): "LABPROT", "INR" in the last 72 hours. CMP     Component Value Date/Time   NA 138 12/17/2021 0446   K 3.9 12/17/2021 0446   CL 111 12/17/2021 0446   CO2 21 (L) 12/17/2021 0446   GLUCOSE 105 (H) 12/17/2021 0446   BUN 9 12/17/2021 0446   CREATININE 0.70 12/17/2021 0446   CREATININE 0.80 09/22/2021 1608   CALCIUM 8.2 (L) 12/17/2021 0446   PROT 7.1 12/17/2021 0446   ALBUMIN 3.4 (L) 12/17/2021 0446   AST 18 12/17/2021 0446   ALT 17 12/17/2021 0446   ALKPHOS 65 12/17/2021 0446   BILITOT 0.9 12/17/2021 0446   GFRNONAA >60 12/17/2021 0446    GFRNONAA 125 04/29/2020 0000   GFRAA 145 04/29/2020 0000   Lipase     Component Value Date/Time   LIPASE 26 12/16/2021 0647    Studies/Results: DG Abd Portable 1V-Small Bowel Obstruction Protocol-initial, 8 hr delay  Result Date: 12/17/2021 CLINICAL DATA:  8 hour follow-up film for small-bowel obstruction. EXAM: PORTABLE ABDOMEN - 1 VIEW COMPARISON:  CT from earlier in the same day. FINDINGS: Gastric catheter is noted within the stomach. Scattered large and small bowel gas is noted. The overall appearance is stable. Administered contrast is not well appreciated and may have been removed by the gastric catheter. No free air is seen. No bony abnormality is noted. IMPRESSION: Administered contrast is not well appreciated. Scattered bowel gas pattern is noted similar to that seen on the prior exam. Electronically Signed   By: Alcide Clever M.D.   On: 12/17/2021 00:29   DG Abd Portable 1V  Result Date: 12/16/2021 CLINICAL DATA:  Chronic abdominal pain EXAM: PORTABLE ABDOMEN - 1 VIEW COMPARISON:  04/01/2018.  CT today. FINDINGS: NG tube tip is in the proximal stomach with the side port in the distal esophagus. Mildly dilated small bowel loops centrally. No organomegaly or suspicious calcification. IMPRESSION: NG tube tip in the proximal stomach. Evidence of small-bowel obstruction. Electronically Signed   By: Charlett Nose M.D.  On: 12/16/2021 10:37   CT Abdomen Pelvis W Contrast  Result Date: 12/16/2021 CLINICAL DATA:  A 36 year old presents with suspected bowel obstruction in the setting of abdominal pain. History obtained in care everywhere discusses history of multiple bowel surgeries as an infant. EXAM: CT ABDOMEN AND PELVIS WITH CONTRAST TECHNIQUE: Multidetector CT imaging of the abdomen and pelvis was performed using the standard protocol following bolus administration of intravenous contrast. RADIATION DOSE REDUCTION: This exam was performed according to the departmental dose-optimization  program which includes automated exposure control, adjustment of the mA and/or kV according to patient size and/or use of iterative reconstruction technique. CONTRAST:  OMNIPAQUE IOHEXOL 300 MG/ML  SOLN COMPARISON:  October 14, 2021. FINDINGS: Lower chest: Incidental imaging of the lung bases is unremarkable. Cardiac structures on limited assessment without acute process. Hepatobiliary: No focal, suspicious hepatic lesion. No pericholecystic stranding. No biliary duct dilation. Portal vein is patent. Hepatic hemangioma in the LEFT hepatic lobe is unchanged. Background hepatic steatosis. Pancreas: Normal, without mass, inflammation or ductal dilatation. Spleen: Normal. Adrenals/Urinary Tract: Adrenal glands are unremarkable. Symmetric renal enhancement. No sign of hydronephrosis. No suspicious renal lesion or perinephric stranding. Urinary bladder is grossly unremarkable. 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 Stomach/Bowel: Small hiatal hernia. No stranding adjacent to the stomach. No stranding adjacent to the jejunum which crosses back across the midline and and back into the LEFT abdomen as on the previous study. There is foreshortening of the colon such that ileocecal resection is suspected with ileocolonic anastomosis in the RIGHT upper quadrant. Pattern of bowel dilation is similar to the exam from January of 2020. Bowel within the mid and distal small bowel is however more distended than on previous imaging. In the distal jejunum there is either a pseudo sacculation or a large jejunal diverticulum. There is no surrounding stranding. Proximal transition point occurring in the mid abdomen. The transition to dilated in non dilated bowel is more gradual in the proximal transition where jejunum goes from nondilated to dilated bowel in the central abdomen. Moderate to marked dilation throughout the distal jejunum extending into the proximal ileum.  Distorted loop of bowel in the RIGHT hemiabdomen displays mural stratification with thickening of the wall. There are small areas of interloop fluid. There is no sign of perforation or abscess. The transition along the distal bowel occurs in the RIGHT paramidline central abdomen best seen on image 44/5. This is upstream from the suspected ileocolonic anastomotic site. The colon is under distended and or collapsed throughout its course. Scattered loops of fluid-filled bowel are seen beyond the site of bowel transition in the RIGHT abdomen. Stool like material is seen just proximal to the transition in the RIGHT mid abdomen. Another jejunal diverticulum is demonstrated on image 40/2. This well-circumscribed collection of gas extending into the mesentery without adjacent stranding from the jejunum is unchanged compared to imaging from March 31, 2018. Vascular/Lymphatic: Patent abdominal vessels. No signs of aneurysmal dilation of the abdominal aorta. No adenopathy in the abdomen or in the pelvis. Reproductive: Moderate prostatomegaly with heterogeneous appearance is nonspecific and unchanged. Other: No pneumatosis, free air or portal venous gas. Small amounts of interloop fluid in the RIGHT lower quadrant about dilated bowel loops. The site of transition is identical to the study of March 31, 2018 but with more distension of bowel loops on today's study reaching maximal caliber near 3.4 cm as compared to 2.8 cm. Musculoskeletal: Signs of avascular  necrosis of RIGHT femoral head without collapse. No destructive bone process or acute bone finding otherwise. IMPRESSION: 1. Signs of bowel obstruction involving the mid ileum for site of transition. Site of transition is at the same site that was seen on the study of January of 2020 but is associated with increased bowel dilation. Interloop fluid seen on today's study is similar to the prior exam and can be seen in the setting of enteritis but is nonspecific. Correlation  with lactate is suggested. 2. Findings above may be due to functional obstruction from enteritis or obstruction due to complex adhesions. Given distortion of bowel and proximal transition of bowel dilation in the central abdomen internal hernia is also considered but findings are again very similar to the study of January of 2020. Ultimately surgical consultation and lactate trend may be helpful for further management. 3. 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 Reportedly the patient had multiple surgeries as an infant or child. Would also correlate with any history of inflammatory bowel disease given surgical alterations seen above 4. Jejunal diverticulosis. 5. Prostatomegaly and heterogeneity similar to prior imaging. 6. These results were called by telephone at the time of interpretation on 12/16/2021 at 9:12 am to provider [facility] , who verbally acknowledged these results. Electronically Signed   By: Zetta Bills M.D.   On: 12/16/2021 09:12    Anti-infectives: Anti-infectives (From admission, onward)    None        Assessment/Plan SBO - CT w/ SBO. There was a question of possible internal hernia by radiology on CT. I reviewed imaging personally and with my attending. He is currently HDS without fever, tachycardia or hypotension. WBC and lactic wnl. No peritonitis on exam. I do not think he needs emergency surgery at this time. He has hx of prior abdominal surgeries as an infant so very likely this could be 2/2 adhesions. Will monitor closely.  - Cont NGT for decompression and keep NPO - Redo SBO protocol - Keep K > 4 and Mg > 2 for bowel function - Mobilize for bowel function - Hopefully patient will improve with conservative management. If patient fails to improve with conservative management or was to acutely worsen, he may require exploratory surgery during admission - Agree with medical admission. We will follow with  you.    FEN - NPO, NGT to LIWS, IVF per [facility] VTE - SCDs, Lovenox ID - None indicated from our standpoint Foley - None, voiding Dispo - Admit to [facility].    HIV   LOS: 1 day    Jillyn Ledger , [facility] Surgery 12/17/2021, 9:13 AM Please see Amion for pager number during day hours 7:00am-4:30pm

## 2021-12-17 NOTE — TOC Progression Note (Signed)
Transition of Care [facility]) - Progression Note    Patient Details  Name: Gordon Turner MRN: [id number] Date of Birth: November 03, 1985  Transition of Care [facility]) CM/SW Contact  Servando Snare, [place] Phone Number: 12/17/2021, 11:21 AM  Clinical Narrative:      Transition of Care [facility]) Screening Note   Patient Details  Name: Gordon Turner Date of Birth: July 04, 1985   Transition of Care [facility]) CM/SW Contact:    Servando Snare, LCSW Phone Number: 12/17/2021, 11:21 AM    Transition of Care Department [facility]) has reviewed patient and no TOC needs have been identified at this time. We will continue to monitor patient advancement through interdisciplinary progression rounds. If new patient transition needs arise, please place a TOC consult.         Expected Discharge Plan and Services                                                 Social Determinants of Health (SDOH) Interventions    Readmission Risk Interventions     No data to display

## 2021-12-17 NOTE — Progress Notes (Signed)
PROGRESS NOTE    Gordon Turner  CWC:[id number] DOB: 06/27/1985 DOA: 12/16/2021 PCP: [place] Callas, NP    Brief Narrative:  Gordon Turner is a 36 y.o. male with medical history significant of asthma, HIV infection, multiple episodes of SBO who is coming to the emergency department with abdominal pain that woke him up from sleep around 0300 associated with nausea and multiple episodes of vomiting similar to the symptoms he develops with SBO.  He has been constipated for the past 2 days   Assessment and Plan:  SBO (small bowel obstruction) ([place]) Observation/MedSurg. Keep NPO. Continue IV fluids. NG tube -GS consult -SBO protocol    Hypokalemia replete     Human immunodeficiency virus (HIV) disease ([place]) -resume home regimen as soon as taking PO     Moderate persistent asthma with exacerbation Bronchodilators as needed. Supplemental oxygen as as needed.  DVT prophylaxis: enoxaparin (LOVENOX) injection 40 mg Start: 12/16/21 2200    Code Status: Full Code   Disposition Plan:  Level of care: Med-Surg Status is: Inpatient Remains inpatient appropriate because: sbo    Consultants:  gs   Subjective: No BM, no flatus  Objective: Vitals:   12/16/21 2137 12/17/21 0143 12/17/21 0616 12/17/21 1007  BP: 134/78 127/73 122/69 124/72  Pulse: 64 92 78 66  Resp: 18 16 16 18   Temp: 99.3 F (37.4 C) 99.3 F (37.4 C) 99.2 F (37.3 C) 98.9 F (37.2 C)  TempSrc:    Oral  SpO2: 100% 100% 96% 99%  Weight:      Height:        Intake/Output Summary (Last 24 hours) at 12/17/2021 1131 Last data filed at 12/17/2021 1000 Gross per 24 hour  Intake 2998.51 ml  Output 1775 ml  Net 1223.51 ml   Filed Weights   12/16/21 0606  Weight: 77.1 kg    Examination:   General: Appearance:     Overweight male with NG tube in place who appears uncomfortable     Lungs:     respirations unlabored  Heart:    Normal heart rate. Normal rhythm. No murmurs, rubs, or gallops.    MS:   All  extremities are intact.    Neurologic:   Awake, alert, oriented x 3. No apparent focal neurological           defect.        Data Reviewed: I have personally reviewed following labs and imaging studies  CBC: Recent Labs  Lab 12/16/21 0647 12/17/21 0446  WBC 6.5 5.4  NEUTROABS 4.5  --   HGB 15.7 14.3  HCT 46.3 42.9  MCV 86.4 88.5  PLT 267 761   Basic Metabolic Panel: Recent Labs  Lab 12/16/21 0647 12/17/21 0446  NA 140 138  K 3.4* 3.9  CL 110 111  CO2 22 21*  GLUCOSE 116* 105*  BUN 14 9  CREATININE 0.80 0.70  CALCIUM 9.4 8.2*  MG 2.1  --    GFR: Estimated Creatinine Clearance: 126.4 mL/min (by C-G formula based on SCr of 0.7 mg/dL). Liver Function Tests: Recent Labs  Lab 12/16/21 0647 12/17/21 0446  AST 25 18  ALT 22 17  ALKPHOS 74 65  BILITOT 0.6 0.9  PROT 8.2* 7.1  ALBUMIN 4.2 3.4*   Recent Labs  Lab 12/16/21 0647  LIPASE 26   No results for input(s): "AMMONIA" in the last 168 hours. Coagulation Profile: No results for input(s): "INR", "PROTIME" in the last 168 hours. Cardiac Enzymes: No results for input(s): "  CKTOTAL", "CKMB", "CKMBINDEX", "TROPONINI" in the last 168 hours. BNP (last 3 results) No results for input(s): "PROBNP" in the last 8760 hours. HbA1C: No results for input(s): "HGBA1C" in the last 72 hours. CBG: No results for input(s): "GLUCAP" in the last 168 hours. Lipid Profile: No results for input(s): "CHOL", "HDL", "LDLCALC", "TRIG", "CHOLHDL", "LDLDIRECT" in the last 72 hours. Thyroid Function Tests: No results for input(s): "TSH", "T4TOTAL", "FREET4", "T3FREE", "THYROIDAB" in the last 72 hours. Anemia Panel: No results for input(s): "VITAMINB12", "FOLATE", "FERRITIN", "TIBC", "IRON", "RETICCTPCT" in the last 72 hours. Sepsis Labs: Recent Labs  Lab 12/16/21 0911 12/16/21 1306  LATICACIDVEN 1.0 0.8    No results found for this or any previous visit (from the past 240 hour(s)).       Radiology Studies: DG Abd  Portable 1V-Small Bowel Obstruction Protocol-initial, 8 hr delay  Result Date: 12/17/2021 CLINICAL DATA:  8 hour follow-up film for small-bowel obstruction. EXAM: PORTABLE ABDOMEN - 1 VIEW COMPARISON:  CT from earlier in the same day. FINDINGS: Gastric catheter is noted within the stomach. Scattered large and small bowel gas is noted. The overall appearance is stable. Administered contrast is not well appreciated and may have been removed by the gastric catheter. No free air is seen. No bony abnormality is noted. IMPRESSION: Administered contrast is not well appreciated. Scattered bowel gas pattern is noted similar to that seen on the prior exam. Electronically Signed   By: Alcide Clever M.D.   On: 12/17/2021 00:29   DG Abd Portable 1V  Result Date: 12/16/2021 CLINICAL DATA:  Chronic abdominal pain EXAM: PORTABLE ABDOMEN - 1 VIEW COMPARISON:  04/01/2018.  CT today. FINDINGS: NG tube tip is in the proximal stomach with the side port in the distal esophagus. Mildly dilated small bowel loops centrally. No organomegaly or suspicious calcification. IMPRESSION: NG tube tip in the proximal stomach. Evidence of small-bowel obstruction. Electronically Signed   By: Charlett Nose M.D.   On: 12/16/2021 10:37   CT Abdomen Pelvis W Contrast  Result Date: 12/16/2021 CLINICAL DATA:  A 36 year old presents with suspected bowel obstruction in the setting of abdominal pain. History obtained in care everywhere discusses history of multiple bowel surgeries as an infant. EXAM: CT ABDOMEN AND PELVIS WITH CONTRAST TECHNIQUE: Multidetector CT imaging of the abdomen and pelvis was performed using the standard protocol following bolus administration of intravenous contrast. RADIATION DOSE REDUCTION: This exam was performed according to the departmental dose-optimization program which includes automated exposure control, adjustment of the mA and/or kV according to patient size and/or use of iterative reconstruction technique. CONTRAST:   OMNIPAQUE IOHEXOL 300 MG/ML  SOLN COMPARISON:  October 14, 2021. FINDINGS: Lower chest: Incidental imaging of the lung bases is unremarkable. Cardiac structures on limited assessment without acute process. Hepatobiliary: No focal, suspicious hepatic lesion. No pericholecystic stranding. No biliary duct dilation. Portal vein is patent. Hepatic hemangioma in the LEFT hepatic lobe is unchanged. Background hepatic steatosis. Pancreas: Normal, without mass, inflammation or ductal dilatation. Spleen: Normal. Adrenals/Urinary Tract: Adrenal glands are unremarkable. Symmetric renal enhancement. No sign of hydronephrosis. No suspicious renal lesion or perinephric stranding. Urinary bladder is grossly unremarkable. 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 Stomach/Bowel: Small hiatal hernia. No stranding adjacent to the stomach. No stranding adjacent to the jejunum which crosses back across the midline and and back into the LEFT abdomen as on the previous study. There is foreshortening of the colon such that ileocecal  resection is suspected with ileocolonic anastomosis in the RIGHT upper quadrant. Pattern of bowel dilation is similar to the exam from January of 2020. Bowel within the mid and distal small bowel is however more distended than on previous imaging. In the distal jejunum there is either a pseudo sacculation or a large jejunal diverticulum. There is no surrounding stranding. Proximal transition point occurring in the mid abdomen. The transition to dilated in non dilated bowel is more gradual in the proximal transition where jejunum goes from nondilated to dilated bowel in the central abdomen. Moderate to marked dilation throughout the distal jejunum extending into the proximal ileum. Distorted loop of bowel in the RIGHT hemiabdomen displays mural stratification with thickening of the wall. There are small areas of interloop fluid. There is no sign of  perforation or abscess. The transition along the distal bowel occurs in the RIGHT paramidline central abdomen best seen on image 44/5. This is upstream from the suspected ileocolonic anastomotic site. The colon is under distended and or collapsed throughout its course. Scattered loops of fluid-filled bowel are seen beyond the site of bowel transition in the RIGHT abdomen. Stool like material is seen just proximal to the transition in the RIGHT mid abdomen. Another jejunal diverticulum is demonstrated on image 40/2. This well-circumscribed collection of gas extending into the mesentery without adjacent stranding from the jejunum is unchanged compared to imaging from March 31, 2018. Vascular/Lymphatic: Patent abdominal vessels. No signs of aneurysmal dilation of the abdominal aorta. No adenopathy in the abdomen or in the pelvis. Reproductive: Moderate prostatomegaly with heterogeneous appearance is nonspecific and unchanged. Other: No pneumatosis, free air or portal venous gas. Small amounts of interloop fluid in the RIGHT lower quadrant about dilated bowel loops. The site of transition is identical to the study of March 31, 2018 but with more distension of bowel loops on today's study reaching maximal caliber near 3.4 cm as compared to 2.8 cm. 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 No destructive bone process or acute bone finding otherwise. IMPRESSION: 1. Signs of bowel obstruction involving the mid ileum for site of transition. Site of transition is at the same site that was seen on the study of January of 2020 but is associated with increased bowel dilation. Interloop fluid seen on today's study is similar to the prior exam and can be seen in the setting of enteritis but is nonspecific. Correlation with lactate is suggested. 2. Findings above may be due to functional obstruction from enteritis or obstruction due to complex adhesions. Given distortion of bowel and  proximal transition of bowel dilation in the central abdomen internal hernia is also considered but findings are again very similar to the study of January of 2020. Ultimately surgical consultation and lactate trend may be helpful for further management. 3. 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 Reportedly the patient had multiple surgeries as an infant or child. Would also correlate with any history of inflammatory bowel disease given surgical alterations seen above 4. Jejunal diverticulosis. 5. Prostatomegaly and heterogeneity similar to prior imaging. 6. These results were called by telephone at the time of interpretation on 12/16/2021 at 9:12 am to provider [facility] , who verbally acknowledged these results. Electronically Signed   By: Donzetta Kohut M.D.   On: 12/16/2021 09:12        Scheduled Meds:  enoxaparin (LOVENOX) injection  40 mg Subcutaneous Q24H   pantoprazole (PROTONIX) IV  40 mg  Intravenous Q24H   Continuous Infusions:  0.9 % NaCl with KCl 20 mEq / L 125 mL/hr at 12/17/21 0740     LOS: 1 day    Time spent: 45 minutes spent on chart review, discussion with nursing staff, consultants, updating family and interview/physical exam; more than 50% of that time was spent in counseling and/or coordination of care.    Joseph ArtJessica U Egbert Seidel, DO [facility] Available via Epic secure chat 7am-7pm After these hours, please refer to coverage provider listed on [email or web address] 12/17/2021, 11:31 AM

## 2021-12-18 ENCOUNTER — Inpatient Hospital Stay (HOSPITAL_COMMUNITY): Payer: BC Managed Care – PPO

## 2021-12-18 ENCOUNTER — Other Ambulatory Visit: Payer: Self-pay

## 2021-12-18 DIAGNOSIS — K56609 Unspecified intestinal obstruction, unspecified as to partial versus complete obstruction: Secondary | ICD-10-CM | POA: Diagnosis not present

## 2021-12-18 NOTE — Progress Notes (Signed)
NG tube pulled per MD order. Patient tolerated well.

## 2021-12-18 NOTE — Progress Notes (Signed)
PROGRESS NOTE    Gordon Turner  NKN:[id number] DOB: [birth year] DOA: 12/16/2021 PCP: [place] Callas, NP    Brief Narrative:  Gordon Turner is a 36 y.o. male with medical history significant of asthma, HIV infection, multiple episodes of SBO who is coming to the emergency department with abdominal pain that woke him up from sleep around 0300 associated with nausea and multiple episodes of vomiting similar to the symptoms he develops with SBO.  He has been constipated for the past 2 days.  NG tube placed. Slowly improving.   Assessment and Plan:  SBO (small bowel obstruction) ([facility]) Keep NPO. Continue IV fluids. NG tube -GS consult -SBO protocol + flatus on 9/23    Hypokalemia replete     Human immunodeficiency virus (HIV) disease ([place]) -resume home regimen as soon as taking PO     Moderate persistent asthma with exacerbation Bronchodilators as needed. Supplemental oxygen as as needed.  DVT prophylaxis: enoxaparin (LOVENOX) injection 40 mg Start: 12/16/21 2200    Code Status: Full Code   Disposition Plan:  Level of care: Med-Surg Status is: Inpatient Remains inpatient appropriate because: sbo    Consultants:  gs   Subjective: +flatus x 2  Objective: Vitals:   12/17/21 1007 12/17/21 1344 12/17/21 2123 12/18/21 0515  BP: 124/72 135/85 129/77 130/73  Pulse: 66 91 91 87  Resp: 18 18 18 18   Temp: 98.9 F (37.2 C) 99.5 F (37.5 C) 98 F (36.7 C) 99.4 F (37.4 C)  TempSrc: Oral Oral Oral Oral  SpO2: 99% 97% 96% 99%  Weight:      Height:        Intake/Output Summary (Last 24 hours) at 12/18/2021 1053 Last data filed at 12/18/2021 1000 Gross per 24 hour  Intake 2208.99 ml  Output 2850 ml  Net -641.01 ml   Filed Weights   12/16/21 0606  Weight: 77.1 kg    Examination:   General: Appearance:     Overweight male in no acute distress, more comfortable today     Lungs:     respirations unlabored  Heart:    Normal heart rate. .   MS:   All extremities  are intact.   Neurologic:   Awake, alert, oriented x 3. No apparent focal neurological           defect.          Data Reviewed: I have personally reviewed following labs and imaging studies  CBC: Recent Labs  Lab 12/16/21 0647 12/17/21 0446  WBC 6.5 5.4  NEUTROABS 4.5  --   HGB 15.7 14.3  HCT 46.3 42.9  MCV 86.4 88.5  PLT 267 379   Basic Metabolic Panel: Recent Labs  Lab 12/16/21 0647 12/17/21 0446  NA 140 138  K 3.4* 3.9  CL 110 111  CO2 22 21*  GLUCOSE 116* 105*  BUN 14 9  CREATININE 0.80 0.70  CALCIUM 9.4 8.2*  MG 2.1  --    GFR: Estimated Creatinine Clearance: 126.4 mL/min (by C-G formula based on SCr of 0.7 mg/dL). Liver Function Tests: Recent Labs  Lab 12/16/21 0647 12/17/21 0446  AST 25 18  ALT 22 17  ALKPHOS 74 65  BILITOT 0.6 0.9  PROT 8.2* 7.1  ALBUMIN 4.2 3.4*   Recent Labs  Lab 12/16/21 0647  LIPASE 26   No results for input(s): "AMMONIA" in the last 168 hours. Coagulation Profile: No results for input(s): "INR", "PROTIME" in the last 168 hours. Cardiac Enzymes: No results for  input(s): "CKTOTAL", "CKMB", "CKMBINDEX", "TROPONINI" in the last 168 hours. BNP (last 3 results) No results for input(s): "PROBNP" in the last 8760 hours. HbA1C: No results for input(s): "HGBA1C" in the last 72 hours. CBG: No results for input(s): "GLUCAP" in the last 168 hours. Lipid Profile: No results for input(s): "CHOL", "HDL", "LDLCALC", "TRIG", "CHOLHDL", "LDLDIRECT" in the last 72 hours. Thyroid Function Tests: No results for input(s): "TSH", "T4TOTAL", "FREET4", "T3FREE", "THYROIDAB" in the last 72 hours. Anemia Panel: No results for input(s): "VITAMINB12", "FOLATE", "FERRITIN", "TIBC", "IRON", "RETICCTPCT" in the last 72 hours. Sepsis Labs: Recent Labs  Lab 12/16/21 0911 12/16/21 1306  LATICACIDVEN 1.0 0.8    No results found for this or any previous visit (from the past 240 hour(s)).       Radiology Studies: DG Abd Portable 1V-Small  Bowel Obstruction Protocol-initial, 8 hr delay  Result Date: 12/17/2021 CLINICAL DATA:  Small bowel obstruction, 8 hour delay. EXAM: PORTABLE ABDOMEN - 1 VIEW COMPARISON:  December 16, 2021 FINDINGS: A nasogastric tube is seen with its distal tip noted within the body of the stomach. Dilated small bowel loops are seen within the mid and lower abdomen. Markedly diluted oral contrast is also suspected throughout the small bowel. No radio-opaque calculi or other significant radiographic abnormality are seen. IMPRESSION: Findings consistent with a persistent small bowel obstruction. Electronically Signed   By: Virgina Norfolk M.D.   On: 12/17/2021 19:55   DG Abd Portable 1V-Small Bowel Obstruction Protocol-initial, 8 hr delay  Result Date: 12/17/2021 CLINICAL DATA:  8 hour follow-up film for small-bowel obstruction. EXAM: PORTABLE ABDOMEN - 1 VIEW COMPARISON:  CT from earlier in the same day. FINDINGS: Gastric catheter is noted within the stomach. Scattered large and small bowel gas is noted. The overall appearance is stable. Administered contrast is not well appreciated and may have been removed by the gastric catheter. No free air is seen. No bony abnormality is noted. IMPRESSION: Administered contrast is not well appreciated. Scattered bowel gas pattern is noted similar to that seen on the prior exam. Electronically Signed   By: Inez Catalina M.D.   On: 12/17/2021 00:29        Scheduled Meds:  enoxaparin (LOVENOX) injection  40 mg Subcutaneous Q24H   pantoprazole (PROTONIX) IV  40 mg Intravenous Q24H   Continuous Infusions:  0.9 % NaCl with KCl 20 mEq / L 125 mL/hr at 12/18/21 0521     LOS: 2 days    Time spent: 45 minutes spent on chart review, discussion with nursing staff, consultants, updating family and interview/physical exam; more than 50% of that time was spent in counseling and/or coordination of care.    Geradine Girt, DO [facility] Available via Epic secure chat  7am-7pm After these hours, please refer to coverage provider listed on [email or web address] 12/18/2021, 10:53 AM

## 2021-12-18 NOTE — Progress Notes (Addendum)
Subjective/Chief Complaint: Flatus this am feels better   Objective: Vital signs in last 24 hours: Temp:  [98 F (36.7 C)-99.5 F (37.5 C)] 99.4 F (37.4 C) (09/23 0515) Pulse Rate:  [87-91] 87 (09/23 0515) Resp:  [18] 18 (09/23 0515) BP: (129-135)/(73-85) 130/73 (09/23 0515) SpO2:  [96 %-99 %] 99 % (09/23 0515) Last BM Date : 12/14/21  Intake/Output from previous day: 09/22 0701 - 09/23 0700 In: 2500.2 [I.V.:2500.2] Out: 2850 [Urine:600; Emesis/NG output:2250] Intake/Output this shift: No intake/output data recorded.  Ab soft nondistended nontender some bs  Lab Results:  Recent Labs    12/16/21 0647 12/17/21 0446  WBC 6.5 5.4  HGB 15.7 14.3  HCT 46.3 42.9  PLT 267 230   BMET Recent Labs    12/16/21 0647 12/17/21 0446  NA 140 138  K 3.4* 3.9  CL 110 111  CO2 22 21*  GLUCOSE 116* 105*  BUN 14 9  CREATININE 0.80 0.70  CALCIUM 9.4 8.2*   PT/INR No results for input(s): "LABPROT", "INR" in the last 72 hours. ABG No results for input(s): "PHART", "HCO3" in the last 72 hours.  Invalid input(s): "PCO2", "PO2"  Studies/Results: DG Abd Portable 1V-Small Bowel Obstruction Protocol-initial, 8 hr delay  Result Date: 12/17/2021 CLINICAL DATA:  Small bowel obstruction, 8 hour delay. EXAM: PORTABLE ABDOMEN - 1 VIEW COMPARISON:  December 16, 2021 FINDINGS: A nasogastric tube is seen with its distal tip noted within the body of the stomach. Dilated small bowel loops are seen within the mid and lower abdomen. Markedly diluted oral contrast is also suspected throughout the small bowel. No radio-opaque calculi or other significant radiographic abnormality are seen. IMPRESSION: Findings consistent with a persistent small bowel obstruction. Electronically Signed   By: Aram Candela M.D.   On: 12/17/2021 19:55   DG Abd Portable 1V-Small Bowel Obstruction Protocol-initial, 8 hr delay  Result Date: 12/17/2021 CLINICAL DATA:  8 hour follow-up film for small-bowel  obstruction. EXAM: PORTABLE ABDOMEN - 1 VIEW COMPARISON:  CT from earlier in the same day. FINDINGS: Gastric catheter is noted within the stomach. Scattered large and small bowel gas is noted. The overall appearance is stable. Administered contrast is not well appreciated and may have been removed by the gastric catheter. No free air is seen. No bony abnormality is noted. IMPRESSION: Administered contrast is not well appreciated. Scattered bowel gas pattern is noted similar to that seen on the prior exam. Electronically Signed   By: Alcide Clever M.D.   On: 12/17/2021 00:29   DG Abd Portable 1V  Result Date: 12/16/2021 CLINICAL DATA:  Chronic abdominal pain EXAM: PORTABLE ABDOMEN - 1 VIEW COMPARISON:  04/01/2018.  CT today. FINDINGS: NG tube tip is in the proximal stomach with the side port in the distal esophagus. Mildly dilated small bowel loops centrally. No organomegaly or suspicious calcification. IMPRESSION: NG tube tip in the proximal stomach. Evidence of small-bowel obstruction. Electronically Signed   By: Charlett Nose M.D.   On: 12/16/2021 10:37    Anti-infectives: Anti-infectives (From admission, onward)    None       Assessment/Plan:  SBO - some flatus, await xray this am  - Cont NGT for decompression and keep NPO - Keep K > 4 and Mg > 2 for bowel function - Mobilize for bowel function - Hopefully patient will improve with conservative management. If patient fails to improve with conservative management or was to acutely worsen, he may require exploratory surgery during admission   FEN -  NPO, NGT to LIWS, IVF per [facility] VTE - SCDs, Lovenox ID - None indicated from our standpoint Foley - None, voiding Dispo - Admit to [facility].   Rolm Bookbinder 12/18/2021   Addendum: will  remove ng tube and try clears today

## 2021-12-19 DIAGNOSIS — K56609 Unspecified intestinal obstruction, unspecified as to partial versus complete obstruction: Secondary | ICD-10-CM | POA: Diagnosis not present

## 2021-12-19 LAB — CBC
HCT: 40.8 % (ref 39.0–52.0)
Hemoglobin: 13.2 g/dL (ref 13.0–17.0)
MCH: 29 pg (ref 26.0–34.0)
MCHC: 32.4 g/dL (ref 30.0–36.0)
MCV: 89.7 fL (ref 80.0–100.0)
Platelets: 210 10*3/uL (ref 150–400)
RBC: 4.55 MIL/uL (ref 4.22–5.81)
RDW: 12.8 % (ref 11.5–15.5)
WBC: 3.8 10*3/uL — ABNORMAL LOW (ref 4.0–10.5)
nRBC: 0 % (ref 0.0–0.2)

## 2021-12-19 LAB — BASIC METABOLIC PANEL
Anion gap: 7 (ref 5–15)
BUN: 11 mg/dL (ref 6–20)
CO2: 22 mmol/L (ref 22–32)
Calcium: 8.3 mg/dL — ABNORMAL LOW (ref 8.9–10.3)
Chloride: 106 mmol/L (ref 98–111)
Creatinine, Ser: 0.67 mg/dL (ref 0.61–1.24)
GFR, Estimated: >60 mL/min (ref >60)
Glucose, Bld: 100 mg/dL — ABNORMAL HIGH (ref 70–99)
Potassium: 3.7 mmol/L (ref 3.5–5.1)
Sodium: 135 mmol/L (ref 135–145)

## 2021-12-19 MED ORDER — BRIMONIDINE TARTRATE 0.2 % OP SOLN
Freq: Every day | OPHTHALMIC | Status: DC
  Administered 2021-12-20: 1 [drp] via OPHTHALMIC
  Filled 2021-12-19: qty 5

## 2021-12-19 MED ORDER — BICTEGRAVIR-EMTRICITAB-TENOFOV 50-200-25 MG PO TABS
1.0000 | ORAL_TABLET | Freq: Every day | ORAL | Status: DC
  Administered 2021-12-19 – 2021-12-20 (×2): 1 via ORAL
  Filled 2021-12-19 (×2): qty 1

## 2021-12-19 NOTE — Progress Notes (Signed)
PROGRESS NOTE    Gordon Turner  UXN:[id number] DOB: 1986-02-27 DOA: 12/16/2021 PCP: Blanchard Kelch, NP    Brief Narrative:  Gordon Turner is a 36 y.o. male with medical history significant of asthma, HIV infection, multiple episodes of SBO who is coming to the emergency department with abdominal pain that woke him up from sleep around 0300 associated with nausea and multiple episodes of vomiting similar to the symptoms he develops with SBO.  He has been constipated for the past 2 days.  NG tube placed. Slowly improving.   Assessment and Plan:  SBO (small bowel obstruction) (HCC) NG tube removed 9/23 -having BMs Continue IV fluids. -GS consult -diet advancing -ambulating    Hypokalemia replete     Human immunodeficiency virus (HIV) disease (HCC) -resume home meds     Moderate persistent asthma with exacerbation Bronchodilators as needed.   DVT prophylaxis: enoxaparin (LOVENOX) injection 40 mg Start: 12/16/21 2200    Code Status: Full Code   Disposition Plan:  Level of care: Med-Surg Status is: Inpatient Remains inpatient appropriate because: sbo    Consultants:  gs   Subjective: Having BMs  Objective: Vitals:   12/18/21 0515 12/18/21 1304 12/18/21 2214 12/19/21 0421  BP: 130/73 133/81 129/74 126/82  Pulse: 87 81 86 80  Resp: 18  18 18   Temp: 99.4 F (37.4 C) 98.5 F (36.9 C) 98.7 F (37.1 C) 98.6 F (37 C)  TempSrc: Oral Oral Oral Oral  SpO2: 99% 100% 97% 100%  Weight:      Height:        Intake/Output Summary (Last 24 hours) at 12/19/2021 1055 Last data filed at 12/19/2021 12/21/2021 Gross per 24 hour  Intake 4692.64 ml  Output 180 ml  Net 4512.64 ml   Filed Weights   12/16/21 0606  Weight: 77.1 kg    Examination:   General: Appearance:     Overweight male in no acute distress     Lungs:     respirations unlabored  Heart:    Normal heart rate. .   MS:   All extremities are intact.   Neurologic:   Awake, alert, oriented x 3. No apparent  focal neurological           defect.          Data Reviewed: I have personally reviewed following labs and imaging studies  CBC: Recent Labs  Lab 12/16/21 0647 12/17/21 0446 12/19/21 0441  WBC 6.5 5.4 3.8*  NEUTROABS 4.5  --   --   HGB 15.7 14.3 13.2  HCT 46.3 42.9 40.8  MCV 86.4 88.5 89.7  PLT 267 230 210   Basic Metabolic Panel: Recent Labs  Lab 12/16/21 0647 12/17/21 0446 12/19/21 0441  NA 140 138 135  K 3.4* 3.9 3.7  CL 110 111 106  CO2 22 21* 22  GLUCOSE 116* 105* 100*  BUN 14 9 11   CREATININE 0.80 0.70 0.67  CALCIUM 9.4 8.2* 8.3*  MG 2.1  --   --    GFR: Estimated Creatinine Clearance: 126.4 mL/min (by C-G formula based on SCr of 0.67 mg/dL). Liver Function Tests: Recent Labs  Lab 12/16/21 0647 12/17/21 0446  AST 25 18  ALT 22 17  ALKPHOS 74 65  BILITOT 0.6 0.9  PROT 8.2* 7.1  ALBUMIN 4.2 3.4*   Recent Labs  Lab 12/16/21 0647  LIPASE 26   No results for input(s): "AMMONIA" in the last 168 hours. Coagulation Profile: No results for input(s): "INR", "PROTIME" in  the last 168 hours. Cardiac Enzymes: No results for input(s): "CKTOTAL", "CKMB", "CKMBINDEX", "TROPONINI" in the last 168 hours. BNP (last 3 results) No results for input(s): "PROBNP" in the last 8760 hours. HbA1C: No results for input(s): "HGBA1C" in the last 72 hours. CBG: No results for input(s): "GLUCAP" in the last 168 hours. Lipid Profile: No results for input(s): "CHOL", "HDL", "LDLCALC", "TRIG", "CHOLHDL", "LDLDIRECT" in the last 72 hours. Thyroid Function Tests: No results for input(s): "TSH", "T4TOTAL", "FREET4", "T3FREE", "THYROIDAB" in the last 72 hours. Anemia Panel: No results for input(s): "VITAMINB12", "FOLATE", "FERRITIN", "TIBC", "IRON", "RETICCTPCT" in the last 72 hours. Sepsis Labs: Recent Labs  Lab 12/16/21 0911 12/16/21 1306  LATICACIDVEN 1.0 0.8    No results found for this or any previous visit (from the past 240 hour(s)).       Radiology  Studies: DG Abd Portable 1V-Small Bowel Obstruction Protocol-24 hr delay  Result Date: 12/18/2021 CLINICAL DATA:  [id number] small-bowel obstruction EXAM: PORTABLE ABDOMEN - 1 VIEW COMPARISON:  December 17, 2021 FINDINGS: Enteric tube tip projects over the stomach with side port projecting over the level of the GE junction. Decreased distension of loops of small bowel in comparison to prior with mild persistent dilation noted in the RIGHT lower quadrant measuring up to 3.4 cm. No enteric contrast is visualized. 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 Electronically Signed   By: Valentino Saxon M.D.   On: 12/18/2021 11:47   DG Abd Portable 1V-Small Bowel Obstruction Protocol-initial, 8 hr delay  Result Date: 12/17/2021 CLINICAL DATA:  Small bowel obstruction, 8 hour delay. EXAM: PORTABLE ABDOMEN - 1 VIEW COMPARISON:  December 16, 2021 FINDINGS: A nasogastric tube is seen with its distal tip noted within the body of the stomach. Dilated small bowel loops are seen within the mid and lower abdomen. Markedly diluted oral contrast is also suspected throughout the small bowel. No radio-opaque calculi or other significant radiographic abnormality are seen. IMPRESSION: Findings consistent with a persistent small bowel obstruction. Electronically Signed   By: Virgina Norfolk M.D.   On: 12/17/2021 19:55        Scheduled Meds:  bictegravir-emtricitabine-tenofovir AF  1 tablet Oral Daily   brimonidine   Both Eyes Daily   enoxaparin (LOVENOX) injection  40 mg Subcutaneous Q24H   pantoprazole (PROTONIX) IV  40 mg Intravenous Q24H   Continuous Infusions:  0.9 % NaCl with KCl 20 mEq / L 125 mL/hr at 12/19/21 0704     LOS: 3 days    Time spent: 45 minutes spent on chart review, discussion with nursing staff, consultants, updating family and interview/physical exam; more than 50% of that time was spent  in counseling and/or coordination of care.    Geradine Girt, DO [facility] Available via Epic secure chat 7am-7pm After these hours, please refer to coverage provider listed on [email or web address] 12/19/2021, 10:55 AM

## 2021-12-19 NOTE — Progress Notes (Signed)
   Subjective/Chief Complaint: Having bms and flatus, tol clears, still states stomach hurts   Objective: Vital signs in last 24 hours: Temp:  [98.5 F (36.9 C)-98.7 F (37.1 C)] 98.6 F (37 C) (09/24 0421) Pulse Rate:  [80-86] 80 (09/24 0421) Resp:  [18] 18 (09/24 0421) BP: (126-133)/(74-82) 126/82 (09/24 0421) SpO2:  [97 %-100 %] 100 % (09/24 0421) Last BM Date : 12/14/21  Intake/Output from previous day: 09/23 0701 - 09/24 0700 In: 4559.3 [P.O.:1320; I.V.:3239.3] Out: 680 [Urine:380; Emesis/NG output:300] Intake/Output this shift: Total I/O In: 133.3 [I.V.:133.3] Out: -   Ab soft nondistended mildly tender diffusely but mostly right side  Lab Results:  Recent Labs    12/17/21 0446 12/19/21 0441  WBC 5.4 3.8*  HGB 14.3 13.2  HCT 42.9 40.8  PLT 230 210   BMET Recent Labs    12/17/21 0446 12/19/21 0441  NA 138 135  K 3.9 3.7  CL 111 106  CO2 21* 22  GLUCOSE 105* 100*  BUN 9 11  CREATININE 0.70 0.67  CALCIUM 8.2* 8.3*   PT/INR No results for input(s): "LABPROT", "INR" in the last 72 hours. ABG No results for input(s): "PHART", "HCO3" in the last 72 hours.  Invalid input(s): "PCO2", "PO2"  Studies/Results: DG Abd Portable 1V-Small Bowel Obstruction Protocol-24 hr delay  Result Date: 12/18/2021 CLINICAL DATA:  [id number] small-bowel obstruction EXAM: PORTABLE ABDOMEN - 1 VIEW COMPARISON:  December 17, 2021 FINDINGS: Enteric tube tip projects over the stomach with side port projecting over the level of the GE junction. Decreased distension of loops of small bowel in comparison to prior with mild persistent dilation noted in the RIGHT lower quadrant measuring up to 3.4 cm. No enteric contrast is visualized. 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 Electronically Signed   By: Valentino Saxon M.D.   On: 12/18/2021 11:47   DG Abd Portable 1V-Small Bowel  Obstruction Protocol-initial, 8 hr delay  Result Date: 12/17/2021 CLINICAL DATA:  Small bowel obstruction, 8 hour delay. EXAM: PORTABLE ABDOMEN - 1 VIEW COMPARISON:  December 16, 2021 FINDINGS: A nasogastric tube is seen with its distal tip noted within the body of the stomach. Dilated small bowel loops are seen within the mid and lower abdomen. Markedly diluted oral contrast is also suspected throughout the small bowel. No radio-opaque calculi or other significant radiographic abnormality are seen. IMPRESSION: Findings consistent with a persistent small bowel obstruction. Electronically Signed   By: Virgina Norfolk M.D.   On: 12/17/2021 19:55    Anti-infectives: Anti-infectives (From admission, onward)    None       Assessment/Plan: SBO - adat - has resolved, has more tenderness than what I would expect but wbc normal (low) and afebrile with normal vitals, not a concerning exam but reasonable to adat and monitor until tomorrow  FEN - adat IVF per [facility] VTE - SCDs, Lovenox ID - None indicated from our standpoint  Rolm Bookbinder 12/19/2021

## 2021-12-20 MED ORDER — TRAMADOL HCL 50 MG PO TABS: 50.0000 mg | ORAL_TABLET | Freq: Four times a day (QID) | ORAL | 0 refills | Status: AC | PRN

## 2021-12-20 MED ORDER — TRAMADOL HCL 50 MG PO TABS: 50.0000 mg | ORAL_TABLET | Freq: Four times a day (QID) | ORAL | Status: DC | PRN

## 2021-12-20 NOTE — Plan of Care (Signed)
  Problem: Education: Goal: Knowledge of General Education information will improve Description: Including pain rating scale, medication(s)/side effects and non-pharmacologic comfort measures 12/20/2021 1056 by Charlyne Petrin, RN Outcome: Adequate for Discharge 12/20/2021 1055 by Charlyne Petrin, RN Outcome: Adequate for Discharge   Problem: Health Behavior/Discharge Planning: Goal: Ability to manage health-related needs will improve 12/20/2021 1056 by Charlyne Petrin, RN Outcome: Adequate for Discharge 12/20/2021 1055 by Charlyne Petrin, RN Outcome: Adequate for Discharge   Problem: Clinical Measurements: Goal: Ability to maintain clinical measurements within normal limits will improve 12/20/2021 1056 by Charlyne Petrin, RN Outcome: Adequate for Discharge 12/20/2021 1055 by Charlyne Petrin, RN Outcome: Adequate for Discharge Goal: Will remain free from infection 12/20/2021 1056 by Charlyne Petrin, RN Outcome: Adequate for Discharge 12/20/2021 1055 by Charlyne Petrin, RN Outcome: Adequate for Discharge Goal: Diagnostic test results will improve 12/20/2021 1056 by Charlyne Petrin, RN Outcome: Adequate for Discharge 12/20/2021 1055 by Charlyne Petrin, RN Outcome: Adequate for Discharge Goal: Respiratory complications will improve 12/20/2021 1056 by Charlyne Petrin, RN Outcome: Adequate for Discharge 12/20/2021 1055 by Charlyne Petrin, RN Outcome: Adequate for Discharge Goal: Cardiovascular complication will be avoided 12/20/2021 1056 by Charlyne Petrin, RN Outcome: Adequate for Discharge 12/20/2021 1055 by Charlyne Petrin, RN Outcome: Adequate for Discharge   Problem: Activity: Goal: Risk for activity intolerance will decrease 12/20/2021 1056 by Charlyne Petrin, RN Outcome: Adequate for Discharge 12/20/2021 1055 by Charlyne Petrin, RN Outcome: Adequate for Discharge   Problem: Nutrition: Goal: Adequate nutrition will be maintained 12/20/2021 1056 by Charlyne Petrin, RN Outcome: Adequate for Discharge 12/20/2021 1055 by Charlyne Petrin,  RN Outcome: Adequate for Discharge   Problem: Coping: Goal: Level of anxiety will decrease 12/20/2021 1056 by Charlyne Petrin, RN Outcome: Adequate for Discharge 12/20/2021 1055 by Charlyne Petrin, RN Outcome: Adequate for Discharge   Problem: Elimination: Goal: Will not experience complications related to bowel motility 12/20/2021 1056 by Charlyne Petrin, RN Outcome: Adequate for Discharge 12/20/2021 1055 by Charlyne Petrin, RN Outcome: Adequate for Discharge Goal: Will not experience complications related to urinary retention 12/20/2021 1056 by Charlyne Petrin, RN Outcome: Adequate for Discharge 12/20/2021 1055 by Charlyne Petrin, RN Outcome: Adequate for Discharge   Problem: Pain Managment: Goal: General experience of comfort will improve 12/20/2021 1056 by Charlyne Petrin, RN Outcome: Adequate for Discharge 12/20/2021 1055 by Charlyne Petrin, RN Outcome: Adequate for Discharge   Problem: Safety: Goal: Ability to remain free from injury will improve 12/20/2021 1056 by Charlyne Petrin, RN Outcome: Adequate for Discharge 12/20/2021 1055 by Charlyne Petrin, RN Outcome: Adequate for Discharge   Problem: Skin Integrity: Goal: Risk for impaired skin integrity will decrease 12/20/2021 1056 by Charlyne Petrin, RN Outcome: Adequate for Discharge 12/20/2021 1055 by Charlyne Petrin, RN Outcome: Adequate for Discharge

## 2021-12-20 NOTE — Plan of Care (Signed)

## 2021-12-20 NOTE — Progress Notes (Signed)
   Subjective/Chief Complaint: Having bms and flatus, tol a diet, still having some bloating and vague abd pains   Objective: Vital signs in last 24 hours: Temp:  [98.4 F (36.9 C)-99.4 F (37.4 C)] 98.4 F (36.9 C) (09/25 0426) Pulse Rate:  [66-74] 67 (09/25 0426) Resp:  [18] 18 (09/25 0426) BP: (118-131)/(73-83) 125/83 (09/25 0426) SpO2:  [100 %] 100 % (09/25 0426) Last BM Date : 12/20/21  Intake/Output from previous day: 09/24 0701 - 09/25 0700 In: 3291.8 [P.O.:360; I.V.:2931.8] Out: 1040 [Urine:1040] Intake/Output this shift: No intake/output data recorded.  Ab soft nondistended mildly tender   Lab Results:  Recent Labs    12/19/21 0441  WBC 3.8*  HGB 13.2  HCT 40.8  PLT 210    BMET Recent Labs    12/19/21 0441  NA 135  K 3.7  CL 106  CO2 22  GLUCOSE 100*  BUN 11  CREATININE 0.67  CALCIUM 8.3*    PT/INR No results for input(s): "LABPROT", "INR" in the last 72 hours. ABG No results for input(s): "PHART", "HCO3" in the last 72 hours.  Invalid input(s): "PCO2", "PO2"  Studies/Results: DG Abd Portable 1V-Small Bowel Obstruction Protocol-24 hr delay  Result Date: 12/18/2021 CLINICAL DATA:  [id number] small-bowel obstruction EXAM: PORTABLE ABDOMEN - 1 VIEW COMPARISON:  December 17, 2021 FINDINGS: Enteric tube tip projects over the stomach with side port projecting over the level of the GE junction. Decreased distension of loops of small bowel in comparison to prior with mild persistent dilation noted in the RIGHT lower quadrant measuring up to 3.4 cm. No enteric contrast is visualized. 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 Electronically Signed   By: Valentino Saxon M.D.   On: 12/18/2021 11:47    Anti-infectives: Anti-infectives (From admission, onward)    Start     Dose/Rate Route Frequency Ordered Stop   12/19/21 1015   bictegravir-emtricitabine-tenofovir AF (BIKTARVY) 50-200-25 MG per tablet 1 tablet        1 tablet Oral Daily 12/19/21 0926         Assessment/Plan: SBO - adat - has resolved, has more tenderness than what I would expect but wbc normal (low) and afebrile with normal vitals - No plans for surgery right now.  Ok to d/c from our standpoint FEN - low residue diet, IVF per [facility] VTE - SCDs, Lovenox ID - None indicated from our standpoint  Rosario Adie [id number]/[id number]

## 2021-12-20 NOTE — Discharge Summary (Signed)
Physician Discharge Summary  Gordon Turner IRC:[id number] DOB: 1985-09-05 DOA: 12/16/2021  PCP: Gordon Kelch, NP  Admit date: 12/16/2021 Discharge date: 12/20/2021  Admitted From: home Discharge disposition: home   Recommendations for Outpatient Follow-Up:   Advance diet as tolerated   Discharge Diagnosis:   Principal Problem:   SBO (small bowel obstruction) (HCC) Active Problems:   Human immunodeficiency virus (HIV) disease (HCC)   Moderate persistent asthma with exacerbation   Hypokalemia   Volume depletion    Discharge Condition: Improved.  Diet recommendation: soft  Wound care: None.  Code status: Full.   History of Present Illness:   Gordon Turner is a 36 y.o. male with medical history significant of asthma, HIV infection, multiple episodes of SBO who is coming to the emergency department with abdominal pain that woke him up from sleep around 0300 associated with nausea and multiple episodes of vomiting similar to the symptoms he develops with SBO.  He has been constipated for the past 2 days.  He denied fever, chills, rhinorrhea, sore throat, wheezing or hemoptysis.  No chest pain, palpitations, diaphoresis, PND, orthopnea or pitting edema of the lower extremities.  No abdominal pain, nausea, emesis, diarrhea, melena or hematochezia.  No flank pain, dysuria, frequency or hematuria.  No polyuria, polydipsia, polyphagia or blurred vision.      Hospital Course by Problem:   SBO (small bowel obstruction) (HCC) NG tube removed 9/23 -having BMs -resolved -GS consult- signed off     Hypokalemia repleted     Human immunodeficiency virus (HIV) disease (HCC) -resume home meds     Moderate persistent asthma with exacerbation Bronchodilators as needed.    Medical Consultants:   GS   Discharge Exam:   Vitals:   12/19/21 2143 12/20/21 0426  BP: 131/81 125/83  Pulse: 66 67  Resp: 18 18  Temp: 99.4 F (37.4 C) 98.4 F (36.9 C)  SpO2: 100%  100%   Vitals:   12/19/21 0421 12/19/21 1212 12/19/21 2143 12/20/21 0426  BP: 126/82 118/73 131/81 125/83  Pulse: 80 74 66 67  Resp: 18 18 18 18   Temp: 98.6 F (37 C) 98.5 F (36.9 C) 99.4 F (37.4 C) 98.4 F (36.9 C)  TempSrc: Oral Oral Oral Oral  SpO2: 100% 100% 100% 100%  Weight:      Height:        General exam: Appears calm and comfortable.    The results of significant diagnostics from this hospitalization (including imaging, microbiology, ancillary and laboratory) are listed below for reference.     Procedures and Diagnostic Studies:   DG Abd Portable 1V-Small Bowel Obstruction Protocol-initial, 8 hr delay  Result Date: 12/17/2021 CLINICAL DATA:  Small bowel obstruction, 8 hour delay. EXAM: PORTABLE ABDOMEN - 1 VIEW COMPARISON:  December 16, 2021 FINDINGS: A nasogastric tube is seen with its distal tip noted within the body of the stomach. Dilated small bowel loops are seen within the mid and lower abdomen. Markedly diluted oral contrast is also suspected throughout the small bowel. No radio-opaque calculi or other significant radiographic abnormality are seen. IMPRESSION: Findings consistent with a persistent small bowel obstruction. Electronically Signed   By: December 18, 2021 M.D.   On: 12/17/2021 19:55   DG Abd Portable 1V-Small Bowel Obstruction Protocol-initial, 8 hr delay  Result Date: 12/17/2021 CLINICAL DATA:  8 hour follow-up film for small-bowel obstruction. EXAM: PORTABLE ABDOMEN - 1 VIEW COMPARISON:  CT from earlier in the same day. FINDINGS: Gastric catheter is noted  within the stomach. Scattered large and small bowel gas is noted. The overall appearance is stable. Administered contrast is not well appreciated and may have been removed by the gastric catheter. No free air is seen. No bony abnormality is noted. IMPRESSION: Administered contrast is not well appreciated. Scattered bowel gas pattern is noted similar to that seen on the prior exam. Electronically  Signed   By: Inez Catalina M.D.   On: 12/17/2021 00:29   DG Abd Portable 1V  Result Date: 12/16/2021 CLINICAL DATA:  Chronic abdominal pain EXAM: PORTABLE ABDOMEN - 1 VIEW COMPARISON:  04/01/2018.  CT today. FINDINGS: NG tube tip is in the proximal stomach with the side port in the distal esophagus. Mildly dilated small bowel loops centrally. No organomegaly or suspicious calcification. IMPRESSION: NG tube tip in the proximal stomach. Evidence of small-bowel obstruction. Electronically Signed   By: Rolm Baptise M.D.   On: 12/16/2021 10:37   CT Abdomen Pelvis W Contrast  Result Date: 12/16/2021 CLINICAL DATA:  A 36 year old presents with suspected bowel obstruction in the setting of abdominal pain. History obtained in care everywhere discusses history of multiple bowel surgeries as an infant. EXAM: CT ABDOMEN AND PELVIS WITH CONTRAST TECHNIQUE: Multidetector CT imaging of the abdomen and pelvis was performed using the standard protocol following bolus administration of intravenous contrast. RADIATION DOSE REDUCTION: This exam was performed according to the departmental dose-optimization program which includes automated exposure control, adjustment of the mA and/or kV according to patient size and/or use of iterative reconstruction technique. CONTRAST:  132mL OMNIPAQUE IOHEXOL 300 MG/ML  SOLN COMPARISON:  October 14, 2021. FINDINGS: Lower chest: Incidental imaging of the lung bases is unremarkable. Cardiac structures on limited assessment without acute process. Hepatobiliary: No focal, suspicious hepatic lesion. No pericholecystic stranding. No biliary duct dilation. Portal vein is patent. Hepatic hemangioma in the LEFT hepatic lobe is unchanged. Background hepatic steatosis. Pancreas: Normal, without mass, inflammation or ductal dilatation. Spleen: Normal. Adrenals/Urinary Tract: Adrenal glands are unremarkable. Symmetric renal enhancement. No sign of hydronephrosis. No suspicious renal lesion or perinephric  stranding. Urinary bladder is grossly unremarkable. 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 Stomach/Bowel: Small hiatal hernia. No stranding adjacent to the stomach. No stranding adjacent to the jejunum which crosses back across the midline and and back into the LEFT abdomen as on the previous study. There is foreshortening of the colon such that ileocecal resection is suspected with ileocolonic anastomosis in the RIGHT upper quadrant. Pattern of bowel dilation is similar to the exam from January of 2020. Bowel within the mid and distal small bowel is however more distended than on previous imaging. In the distal jejunum there is either a pseudo sacculation or a large jejunal diverticulum. There is no surrounding stranding. Proximal transition point occurring in the mid abdomen. The transition to dilated in non dilated bowel is more gradual in the proximal transition where jejunum goes from nondilated to dilated bowel in the central abdomen. Moderate to marked dilation throughout the distal jejunum extending into the proximal ileum. Distorted loop of bowel in the RIGHT hemiabdomen displays mural stratification with thickening of the wall. There are small areas of interloop fluid. There is no sign of perforation or abscess. The transition along the distal bowel occurs in the RIGHT paramidline central abdomen best seen on image 44/5. This is upstream from the suspected ileocolonic anastomotic site. The colon is under distended and or collapsed throughout its course. Scattered loops  of fluid-filled bowel are seen beyond the site of bowel transition in the RIGHT abdomen. Stool like material is seen just proximal to the transition in the RIGHT mid abdomen. Another jejunal diverticulum is demonstrated on image 40/2. This well-circumscribed collection of gas extending into the mesentery without adjacent stranding from the jejunum is unchanged compared to imaging  from March 31, 2018. Vascular/Lymphatic: Patent abdominal vessels. No signs of aneurysmal dilation of the abdominal aorta. No adenopathy in the abdomen or in the pelvis. Reproductive: Moderate prostatomegaly with heterogeneous appearance is nonspecific and unchanged. Other: No pneumatosis, free air or portal venous gas. Small amounts of interloop fluid in the RIGHT lower quadrant about dilated bowel loops. The site of transition is identical to the study of March 31, 2018 but with more distension of bowel loops on today's study reaching maximal caliber near 3.4 cm as compared to 2.8 cm. 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 No destructive bone process or acute bone finding otherwise. IMPRESSION: 1. Signs of bowel obstruction involving the mid ileum for site of transition. Site of transition is at the same site that was seen on the study of January of 2020 but is associated with increased bowel dilation. Interloop fluid seen on today's study is similar to the prior exam and can be seen in the setting of enteritis but is nonspecific. Correlation with lactate is suggested. 2. Findings above may be due to functional obstruction from enteritis or obstruction due to complex adhesions. Given distortion of bowel and proximal transition of bowel dilation in the central abdomen internal hernia is also considered but findings are again very similar to the study of January of 2020. Ultimately surgical consultation and lactate trend may be helpful for further management. 3. 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 Reportedly the patient had multiple surgeries as an infant or child. Would also correlate with any history of inflammatory bowel disease given surgical alterations seen above 4. Jejunal diverticulosis. 5. Prostatomegaly and heterogeneity similar to prior imaging. 6. These results were called by telephone at the  time of interpretation on 12/16/2021 at 9:12 am to provider [facility] , who verbally acknowledged these results. Electronically Signed   By: Donzetta KohutGeoffrey  Wile M.D.   On: 12/16/2021 09:12     Labs:   Basic Metabolic Panel: Recent Labs  Lab 12/16/21 0647 12/17/21 0446 12/19/21 0441  NA 140 138 135  K 3.4* 3.9 3.7  CL 110 111 106  CO2 22 21* 22  GLUCOSE 116* 105* 100*  BUN 14 9 11   CREATININE 0.80 0.70 0.67  CALCIUM 9.4 8.2* 8.3*  MG 2.1  --   --    GFR Estimated Creatinine Clearance: 126.4 mL/min (by C-G formula based on SCr of 0.67 mg/dL). Liver Function Tests: Recent Labs  Lab 12/16/21 0647 12/17/21 0446  AST 25 18  ALT 22 17  ALKPHOS 74 65  BILITOT 0.6 0.9  PROT 8.2* 7.1  ALBUMIN 4.2 3.4*   Recent Labs  Lab 12/16/21 0647  LIPASE 26   No results for input(s): "AMMONIA" in the last 168 hours. Coagulation profile No results for input(s): "INR", "PROTIME" in the last 168 hours.  CBC: Recent Labs  Lab 12/16/21 0647 12/17/21 0446 12/19/21 0441  WBC 6.5 5.4 3.8*  NEUTROABS 4.5  --   --   HGB 15.7 14.3 13.2  HCT 46.3 42.9 40.8  MCV 86.4 88.5 89.7  PLT 267 230 210  Cardiac Enzymes: No results for input(s): "CKTOTAL", "CKMB", "CKMBINDEX", "TROPONINI" in the last 168 hours. BNP: Invalid input(s): "POCBNP" CBG: No results for input(s): "GLUCAP" in the last 168 hours. D-Dimer No results for input(s): "DDIMER" in the last 72 hours. Hgb A1c No results for input(s): "HGBA1C" in the last 72 hours. Lipid Profile No results for input(s): "CHOL", "HDL", "LDLCALC", "TRIG", "CHOLHDL", "LDLDIRECT" in the last 72 hours. Thyroid function studies No results for input(s): "TSH", "T4TOTAL", "T3FREE", "THYROIDAB" in the last 72 hours.  Invalid input(s): "FREET3" Anemia work up No results for input(s): "VITAMINB12", "FOLATE", "FERRITIN", "TIBC", "IRON", "RETICCTPCT" in the last 72 hours. Microbiology No results found for this or any previous visit (from the past  240 hour(s)).   Discharge Instructions:   Discharge Instructions     Discharge instructions   Complete by: As directed    Soft/low residue diet   Increase activity slowly   Complete by: As directed       Allergies as of 12/20/2021       Reactions   Amoxicillin Anaphylaxis   Penicillins Anaphylaxis, Hives, Swelling, Other (See Comments)   DID THE REACTION INVOLVE: Swelling of the face/tongue/throat, SOB, or low BP? No Sudden or severe rash/hives, skin peeling, or the inside of the mouth or nose? No Did it require medical treatment? No When did it last happen?     baby If all above answers are "NO", may proceed with cephalosporin use.        Medication List     STOP taking these medications    ciprofloxacin 500 MG tablet Commonly known as: CIPRO       TAKE these medications    albuterol 108 (90 Base) MCG/ACT inhaler Commonly known as: VENTOLIN HFA Inhale 1-2 puffs into the lungs every 6 (six) hours as needed for wheezing or shortness of breath. What changed:  how much to take when to take this   Biktarvy 50-200-25 MG Tabs tablet Generic drug: bictegravir-emtricitabine-tenofovir AF Take 1 tablet by mouth daily.   Combivent Respimat 20-100 MCG/ACT Aers respimat Generic drug: Ipratropium-Albuterol Inhale 1 puff into the lungs every 6 (six) hours. What changed:  when to take this reasons to take this   diphenhydrAMINE 25 MG tablet Commonly known as: BENADRYL 1 to 2 tablets every 6 hours for itching and swelling What changed:  how much to take how to take this when to take this reasons to take this additional instructions   ipratropium 0.02 % nebulizer solution Commonly known as: ATROVENT Take 0.5 mg by nebulization as needed for shortness of breath or wheezing.   LUMIFY OP Place 1 drop into both eyes daily.   ondansetron 4 MG disintegrating tablet Commonly known as: ZOFRAN-ODT Take 1 tablet (4 mg total) by mouth every 8 (eight) hours as needed  for nausea or vomiting. What changed:  how much to take when to take this   traMADol 50 MG tablet Commonly known as: ULTRAM Take 1 tablet (50 mg total) by mouth every 6 (six) hours as needed for moderate pain.   triamcinolone ointment 0.5 % Commonly known as: KENALOG Apply 1 Application topically 2 (two) times daily.        Follow-up Information     [facility] Follow up.   Specialty: General Surgery Why: As needed Contact information: [address] [place] [address] [phone]                 Time coordinating discharge: 28  min  Signed:  Joseph Art DO  [facility] 12/20/2021, 10:31 AM

## 2021-12-20 NOTE — Discharge Instructions (Signed)
Low-Fiber/Low-Residue Diet  A low-fiber/low-residue diet is for people who need to rest their digestive system (gastrointestinal tract). A ow-fiber/low-residue diet limits the amount of food waste that has to move through the large intestine. In addition to  limiting foods high in fiber, it may be necessary to limit foods that cause residue such as milk and milk products.   Lowfiber/low residue diet helps to resolve symptoms of conditions such as:  Diarrhea  Abdominal cramping  Bowel obstruction   Slow Transit constipation  Gastroparesis  Other gastrointestinal distress Following the diet temporarily, helps to improve symptoms and make eating more enjoyable. After your symptoms have resolved, you will gradually introduce high fiber and residue-causing foods back into your diet. If you follow this diet for more than two (2) weeks, it is recommended that you take a daily multivitamin with minerals. Please contact your doctor or dietitian if needed.  What Are The Guidelines Of The Low Fiber/Low Residue Diet?  Avoid any food made with seeds, nuts, or raw or dried fruit.  Avoid whole-grain breads and cereals, purchase products made from refined white flour.  Do not eat raw fruits or vegetables and remove skins before cooking.  Limit milk and milk products to 2 cups per day. Use lactose-free products if you are lactose intolerant.  Limit foods high in fat.

## 2021-12-24 ENCOUNTER — Telehealth: Payer: BC Managed Care – PPO | Admitting: Physician Assistant

## 2021-12-24 ENCOUNTER — Telehealth: Payer: BC Managed Care – PPO | Admitting: Emergency Medicine

## 2021-12-24 DIAGNOSIS — U071 COVID-19: Secondary | ICD-10-CM

## 2021-12-24 DIAGNOSIS — J45901 Unspecified asthma with (acute) exacerbation: Secondary | ICD-10-CM | POA: Diagnosis not present

## 2021-12-24 DIAGNOSIS — R059 Cough, unspecified: Secondary | ICD-10-CM | POA: Diagnosis not present

## 2021-12-24 DIAGNOSIS — R509 Fever, unspecified: Secondary | ICD-10-CM | POA: Diagnosis not present

## 2021-12-24 DIAGNOSIS — B9689 Other specified bacterial agents as the cause of diseases classified elsewhere: Secondary | ICD-10-CM

## 2021-12-24 MED ORDER — AZITHROMYCIN 250 MG PO TABS: ORAL_TABLET | ORAL | 0 refills | Status: AC

## 2021-12-24 MED ORDER — PREDNISONE 20 MG PO TABS: 40.0000 mg | ORAL_TABLET | Freq: Every day | ORAL | 0 refills | Status: AC

## 2021-12-24 MED ORDER — MOLNUPIRAVIR EUA 200MG CAPSULE: 4.0000 | ORAL_CAPSULE | Freq: Two times a day (BID) | ORAL | 0 refills | Status: AC

## 2021-12-24 NOTE — Patient Instructions (Signed)
Gordon Turner, thank you for joining Margaretann Loveless, PA-C for today's virtual visit.  While this provider is not your primary care provider (PCP), if your PCP is located in our provider database this encounter information will be shared with them immediately following your visit.  Consent: (Patient) Gordon Turner provided verbal consent for this virtual visit at the beginning of the encounter.  Current Medications:  Current Outpatient Medications:    azithromycin (ZITHROMAX) 250 MG tablet, Take 2 tablets on day 1, then 1 tablet daily on days 2 through 5, Disp: 6 tablet, Rfl: 0   albuterol (VENTOLIN HFA) 108 (90 Base) MCG/ACT inhaler, Inhale 1-2 puffs into the lungs every 6 (six) hours as needed for wheezing or shortness of breath. (Patient taking differently: Inhale 2 puffs into the lungs daily as needed for wheezing or shortness of breath.), Disp: 18 g, Rfl: 1   bictegravir-emtricitabine-tenofovir AF (BIKTARVY) 50-200-25 MG TABS tablet, Take 1 tablet by mouth daily., Disp: , Rfl:    Brimonidine Tartrate (LUMIFY OP), Place 1 drop into both eyes daily., Disp: , Rfl:    diphenhydrAMINE (BENADRYL) 25 MG tablet, 1 to 2 tablets every 6 hours for itching and swelling (Patient taking differently: Take 50 mg by mouth at bedtime as needed for sleep.), Disp: 20 tablet, Rfl: 0   ipratropium (ATROVENT) 0.02 % nebulizer solution, Take 0.5 mg by nebulization as needed for shortness of breath or wheezing., Disp: , Rfl:    Ipratropium-Albuterol (COMBIVENT RESPIMAT) 20-100 MCG/ACT AERS respimat, Inhale 1 puff into the lungs every 6 (six) hours. (Patient taking differently: Inhale 1 puff into the lungs as needed for shortness of breath.), Disp: 4 g, Rfl: 0   molnupiravir EUA (LAGEVRIO) 200 mg CAPS capsule, Take 4 capsules (800 mg total) by mouth 2 (two) times daily for 5 days., Disp: 40 capsule, Rfl: 0   ondansetron (ZOFRAN-ODT) 4 MG disintegrating tablet, Take 1 tablet (4 mg total) by mouth every 8 (eight) hours  as needed for nausea or vomiting. (Patient taking differently: Take 8 mg by mouth daily as needed for nausea or vomiting.), Disp: 30 tablet, Rfl: 1   predniSONE (DELTASONE) 20 MG tablet, Take 2 tablets (40 mg total) by mouth daily., Disp: 10 tablet, Rfl: 0   traMADol (ULTRAM) 50 MG tablet, Take 1 tablet (50 mg total) by mouth every 6 (six) hours as needed for moderate pain., Disp: 15 tablet, Rfl: 0   triamcinolone ointment (KENALOG) 0.5 %, Apply 1 Application topically 2 (two) times daily. (Patient not taking: Reported on 12/16/2021), Disp: 30 g, Rfl: 0   Medications ordered in this encounter:  Meds ordered this encounter  Medications   azithromycin (ZITHROMAX) 250 MG tablet    Sig: Take 2 tablets on day 1, then 1 tablet daily on days 2 through 5    Dispense:  6 tablet    Refill:  0    Order Specific Question:   Supervising Provider    Answer:   Merrilee Jansky [[id number]]     *If you need refills on other medications prior to your next appointment, please contact your pharmacy*  Follow-Up: Call back or seek an in-person evaluation if the symptoms worsen or if the condition fails to improve as anticipated.   Virtual Care [phone]  Other Instructions Sinus Infection, Adult A sinus infection, also called sinusitis, is inflammation of your sinuses. Sinuses are hollow spaces in the bones around your face. Your sinuses are located: Around your eyes. In the middle of your  forehead. Behind your nose. In your cheekbones. Mucus normally drains out of your sinuses. When your nasal tissues become inflamed or swollen, mucus can become trapped or blocked. This allows bacteria, viruses, and fungi to grow, which leads to infection. Most infections of the sinuses are caused by a virus. A sinus infection can develop quickly. It can last for up to 4 weeks (acute) or for more than 12 weeks (chronic). A sinus infection often develops after a cold. What are the causes? This condition is  caused by anything that creates swelling in the sinuses or stops mucus from draining. This includes: Allergies. Asthma. Infection from bacteria or viruses. Deformities or blockages in your nose or sinuses. Abnormal growths in the nose (nasal polyps). Pollutants, such as chemicals or irritants in the air. Infection from fungi. This is rare. What increases the risk? You are more likely to develop this condition if you: Have a weak body defense system (immune system). Do a lot of swimming or diving. Overuse nasal sprays. Smoke. What are the signs or symptoms? The main symptoms of this condition are pain and a feeling of pressure around the affected sinuses. Other symptoms include: Stuffy nose or congestion that makes it difficult to breathe through your nose. Thick yellow or greenish drainage from your nose. Tenderness, swelling, and warmth over the affected sinuses. A cough that may get worse at night. Decreased sense of smell and taste. Extra mucus that collects in the throat or the back of the nose (postnasal drip) causing a sore throat or bad breath. Tiredness (fatigue). Fever. How is this diagnosed? This condition is diagnosed based on: Your symptoms. Your medical history. A physical exam. Tests to find out if your condition is acute or chronic. This may include: Checking your nose for nasal polyps. Viewing your sinuses using a device that has a light (endoscope). Testing for allergies or bacteria. Imaging tests, such as an MRI or CT scan. In rare cases, a bone biopsy may be done to rule out more serious types of fungal sinus disease. How is this treated? Treatment for a sinus infection depends on the cause and whether your condition is chronic or acute. If caused by a virus, your symptoms should go away on their own within 10 days. You may be given medicines to relieve symptoms. They include: Medicines that shrink swollen nasal passages (decongestants). A spray that eases  inflammation of the nostrils (topical intranasal corticosteroids). Rinses that help get rid of thick mucus in your nose (nasal saline washes). Medicines that treat allergies (antihistamines). Over-the-counter pain relievers. If caused by bacteria, your health care provider may recommend waiting to see if your symptoms improve. Most bacterial infections will get better without antibiotic medicine. You may be given antibiotics if you have: A severe infection. A weak immune system. If caused by narrow nasal passages or nasal polyps, surgery may be needed. Follow these instructions at home: Medicines Take, use, or apply over-the-counter and prescription medicines only as told by your health care provider. These may include nasal sprays. If you were prescribed an antibiotic medicine, take it as told by your health care provider. Do not stop taking the antibiotic even if you start to feel better. Hydrate and humidify  Drink enough fluid to keep your urine pale yellow. Staying hydrated will help to thin your mucus. Use a cool mist humidifier to keep the humidity level in your home above 50%. Inhale steam for 10-15 minutes, 3-4 times a day, or as told by your health care  provider. You can do this in the bathroom while a hot shower is running. Limit your exposure to cool or dry air. Rest Rest as much as possible. Sleep with your head raised (elevated). Make sure you get enough sleep each night. General instructions  Apply a warm, moist washcloth to your face 3-4 times a day or as told by your health care provider. This will help with discomfort. Use nasal saline washes as often as told by your health care provider. Wash your hands often with soap and water to reduce your exposure to germs. If soap and water are not available, use hand sanitizer. Do not smoke. Avoid being around people who are smoking (secondhand smoke). Keep all follow-up visits. This is important. Contact a health care provider  if: You have a fever. Your symptoms get worse. Your symptoms do not improve within 10 days. Get help right away if: You have a severe headache. You have persistent vomiting. You have severe pain or swelling around your face or eyes. You have vision problems. You develop confusion. Your neck is stiff. You have trouble breathing. These symptoms may be an emergency. Get help right away. Call 911. Do not wait to see if the symptoms will go away. Do not drive yourself to the hospital. Summary A sinus infection is soreness and inflammation of your sinuses. Sinuses are hollow spaces in the bones around your face. This condition is caused by nasal tissues that become inflamed or swollen. The swelling traps or blocks the flow of mucus. This allows bacteria, viruses, and fungi to grow, which leads to infection. If you were prescribed an antibiotic medicine, take it as told by your health care provider. Do not stop taking the antibiotic even if you start to feel better. Keep all follow-up visits. This is important. This information is not intended to replace advice given to you by your health care provider. Make sure you discuss any questions you have with your health care provider. Document Revised: 02/16/2021 Document Reviewed: 02/16/2021 Elsevier Patient Education  [place].    If you have been instructed to have an in-person evaluation today at a local Urgent Care facility, please use the link below. It will take you to a list of all of our available [facility], including address, phone number and hours of operation. Please do not delay care.  [facility]  If you or a family member do not have a primary care provider, use the link below to schedule a visit and establish care. When you choose a [facility] primary care physician or advanced practice provider, you gain a long-term partner in health. Find a Primary Care Provider  Learn more about [facility]'s in-office and virtual care options: [facility]

## 2021-12-24 NOTE — Progress Notes (Signed)
Virtual Visit Consent   Gordon Turner, you are scheduled for a virtual visit with a [facility] provider today. Just as with appointments in the office, your consent must be obtained to participate. Your consent will be active for this visit and any virtual visit you may have with one of our providers in the next 365 days. If you have a MyChart account, a copy of this consent can be sent to you electronically.  As this is a virtual visit, video technology does not allow for your provider to perform a traditional examination. This may limit your provider's ability to fully assess your condition. If your provider identifies any concerns that need to be evaluated in person or the need to arrange testing (such as labs, EKG, etc.), we will make arrangements to do so. Although advances in technology are sophisticated, we cannot ensure that it will always work on either your end or our end. If the connection with a video visit is poor, the visit may have to be switched to a telephone visit. With either a video or telephone visit, we are not always able to ensure that we have a secure connection.  By engaging in this virtual visit, you consent to the provision of healthcare and authorize for your insurance to be billed (if applicable) for the services provided during this visit. Depending on your insurance coverage, you may receive a charge related to this service.  I need to obtain your verbal consent now. Are you willing to proceed with your visit today? Gordon Turner has provided verbal consent on 12/24/2021 for a virtual visit (video or telephone). Montine Circle, PA-C  Date: 12/24/2021 11:01 AM  Virtual Visit via Video Note   I, Montine Circle, connected with  Gordon Turner  [name], Jul 21, 1985) on 12/24/21 at 11:00 AM EDT by a video-enabled telemedicine application and verified that I am speaking with the correct person using two identifiers.  Location: Patient: Virtual Visit Location Patient:  Home Provider: Virtual Visit Location Provider: Home   I discussed the limitations of evaluation and management by telemedicine and the availability of in person appointments. The patient expressed understanding and agreed to proceed.    History of Present Illness: Gordon Turner is a 36 y.o. who identifies as a male who was assigned male at birth, and is being seen today for COVID 35.  States that he has cough and that his asthma is flaring up.  He states he feels like he needs a steroid for his asthma.  Was recently admitted for SBO.  Taking biktarvy.  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   Healthcare maintenance 09/22/2021   SBO (small bowel obstruction) ([place]) 03/31/2018   Celiac disease 10/20/2016   Moderate persistent asthma with exacerbation 05/12/2016   Depressive disorder 12/26/2013   Allergic rhinitis due to pollen 09/25/2013   Insomnia 09/25/2013   Panic disorder without agoraphobia 09/25/2013   Chronic sinusitis 04/04/2013   Human immunodeficiency virus (HIV) disease ([place]) 09/27/2011    Allergies:  Allergies  Allergen Reactions   Amoxicillin Anaphylaxis   Penicillins Anaphylaxis, Hives, Swelling and Other (See Comments)    DID THE REACTION INVOLVE: Swelling of the face/tongue/throat, SOB, or low BP? No Sudden or severe rash/hives, skin peeling, or the inside of the mouth or nose? No Did it require medical treatment? No When did it last happen?  baby If all above answers are "NO", may proceed with cephalosporin use.   Medications:  Current Outpatient Medications:    albuterol (VENTOLIN HFA) 108 (90 Base) MCG/ACT inhaler, Inhale 1-2 puffs into the lungs every 6 (six) hours as needed for wheezing or shortness of breath. (Patient taking differently: Inhale 2 puffs into the lungs daily as needed for wheezing or shortness of breath.), Disp: 18 g, Rfl:  1   bictegravir-emtricitabine-tenofovir AF (BIKTARVY) 50-200-25 MG TABS tablet, Take 1 tablet by mouth daily., Disp: , Rfl:    Brimonidine Tartrate (LUMIFY OP), Place 1 drop into both eyes daily., Disp: , Rfl:    diphenhydrAMINE (BENADRYL) 25 MG tablet, 1 to 2 tablets every 6 hours for itching and swelling (Patient taking differently: Take 50 mg by mouth at bedtime as needed for sleep.), Disp: 20 tablet, Rfl: 0   ipratropium (ATROVENT) 0.02 % nebulizer solution, Take 0.5 mg by nebulization as needed for shortness of breath or wheezing., Disp: , Rfl:    Ipratropium-Albuterol (COMBIVENT RESPIMAT) 20-100 MCG/ACT AERS respimat, Inhale 1 puff into the lungs every 6 (six) hours. (Patient taking differently: Inhale 1 puff into the lungs as needed for shortness of breath.), Disp: 4 g, Rfl: 0   ondansetron (ZOFRAN-ODT) 4 MG disintegrating tablet, Take 1 tablet (4 mg total) by mouth every 8 (eight) hours as needed for nausea or vomiting. (Patient taking differently: Take 8 mg by mouth daily as needed for nausea or vomiting.), Disp: 30 tablet, Rfl: 1   traMADol (ULTRAM) 50 MG tablet, Take 1 tablet (50 mg total) by mouth every 6 (six) hours as needed for moderate pain., Disp: 15 tablet, Rfl: 0   triamcinolone ointment (KENALOG) 0.5 %, Apply 1 Application topically 2 (two) times daily. (Patient not taking: Reported on 12/16/2021), Disp: 30 g, Rfl: 0  Observations/Objective: Patient is well-developed, well-nourished in no acute distress.  Resting comfortably  at home.  Head is normocephalic, atraumatic.  No labored breathing.  Speech is clear and coherent with logical content.  Patient is alert and oriented at baseline.    Assessment and Plan: 1. COVID-19  2. Exacerbation of asthma, unspecified asthma severity, unspecified whether persistent  - Prednisone for asthma exacerbation - Molnupiravir for COVID - F/u if not improving   Follow Up Instructions: I discussed the assessment and treatment plan with  the patient. The patient was provided an opportunity to ask questions and all were answered. The patient agreed with the plan and demonstrated an understanding of the instructions.  A copy of instructions were sent to the patient via MyChart unless otherwise noted below.     The patient was advised to call back or seek an in-person evaluation if the symptoms worsen or if the condition fails to improve as anticipated.  Time:  I spent 13 minutes with the patient via telehealth technology discussing the above problems/concerns.    Montine Circle, PA-C

## 2021-12-24 NOTE — Progress Notes (Signed)
Virtual Visit Consent   [name], you are scheduled for a virtual visit with a [facility] provider today. Just as with appointments in the office, your consent must be obtained to participate. Your consent will be active for this visit and any virtual visit you may have with one of our providers in the next 365 days. If you have a MyChart account, a copy of this consent can be sent to you electronically.  As this is a virtual visit, video technology does not allow for your provider to perform a traditional examination. This may limit your provider's ability to fully assess your condition. If your provider identifies any concerns that need to be evaluated in person or the need to arrange testing (such as labs, EKG, etc.), we will make arrangements to do so. Although advances in technology are sophisticated, we cannot ensure that it will always work on either your end or our end. If the connection with a video visit is poor, the visit may have to be switched to a telephone visit. With either a video or telephone visit, we are not always able to ensure that we have a secure connection.  By engaging in this virtual visit, you consent to the provision of healthcare and authorize for your insurance to be billed (if applicable) for the services provided during this visit. Depending on your insurance coverage, you may receive a charge related to this service.  I need to obtain your verbal consent now. Are you willing to proceed with your visit today? Gordon Turner has provided verbal consent on 12/24/2021 for a virtual visit (video or telephone). Gordon Daring, PA-C  Date: 12/24/2021 5:34 PM  Virtual Visit via Video Note   I, Gordon Turner, connected with  Gordon Turner  [name], 29-Apr-1985) on 12/24/21 at  5:30 PM EDT by a video-enabled telemedicine application and verified that I am speaking with the correct person using two identifiers.  Location: Patient: Virtual Visit Location Patient:  Mobile Provider: Virtual Visit Location Provider: Home Office   I discussed the limitations of evaluation and management by telemedicine and the availability of in person appointments. The patient expressed understanding and agreed to proceed.    History of Present Illness: Gordon Turner is a 36 y.o. who identifies as a male who was assigned male at birth, and is being seen today for possible sinus infection.  HPI: Sinusitis This is a new problem. The current episode started in the past 7 days. The problem has been gradually worsening since onset. There has been no fever. Associated symptoms include chills, congestion, headaches, shortness of breath and sinus pressure. Pertinent negatives include no ear pain, hoarse voice or sore throat. The treatment provided no relief.  Took at home Covid 19 test this morning that appeared positive, did a video visit and was prescribed Prednisone and Molnupiravir. Reports he went to [facility] and had another Covid test completed that was negative. They told him he did not have Covid and his home test were probably expired. He is requesting a Zpack instead for a sinus infection.     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   Healthcare maintenance 09/22/2021   SBO (small bowel obstruction) ([place]) 03/31/2018   Celiac disease 10/20/2016   Moderate persistent asthma with exacerbation 05/12/2016   Depressive disorder 12/26/2013   Allergic rhinitis due to pollen 09/25/2013   Insomnia 09/25/2013  Panic disorder without agoraphobia 09/25/2013   Chronic sinusitis 04/04/2013   Human immunodeficiency virus (HIV) disease ([place]) 09/27/2011    Allergies:  Allergies  Allergen Reactions   Amoxicillin Anaphylaxis   Penicillins Anaphylaxis, Hives, Swelling and Other (See Comments)    DID THE REACTION INVOLVE: Swelling of the face/tongue/throat, SOB, or low BP?  No Sudden or severe rash/hives, skin peeling, or the inside of the mouth or nose? No Did it require medical treatment? No When did it last happen?     baby If all above answers are "NO", may proceed with cephalosporin use.   Medications:  Current Outpatient Medications:    azithromycin (ZITHROMAX) 250 MG tablet, Take 2 tablets on day 1, then 1 tablet daily on days 2 through 5, Disp: 6 tablet, Rfl: 0   albuterol (VENTOLIN HFA) 108 (90 Base) MCG/ACT inhaler, Inhale 1-2 puffs into the lungs every 6 (six) hours as needed for wheezing or shortness of breath. (Patient taking differently: Inhale 2 puffs into the lungs daily as needed for wheezing or shortness of breath.), Disp: 18 g, Rfl: 1   bictegravir-emtricitabine-tenofovir AF (BIKTARVY) 50-200-25 MG TABS tablet, Take 1 tablet by mouth daily., Disp: , Rfl:    Brimonidine Tartrate (LUMIFY OP), Place 1 drop into both eyes daily., Disp: , Rfl:    diphenhydrAMINE (BENADRYL) 25 MG tablet, 1 to 2 tablets every 6 hours for itching and swelling (Patient taking differently: Take 50 mg by mouth at bedtime as needed for sleep.), Disp: 20 tablet, Rfl: 0   ipratropium (ATROVENT) 0.02 % nebulizer solution, Take 0.5 mg by nebulization as needed for shortness of breath or wheezing., Disp: , Rfl:    Ipratropium-Albuterol (COMBIVENT RESPIMAT) 20-100 MCG/ACT AERS respimat, Inhale 1 puff into the lungs every 6 (six) hours. (Patient taking differently: Inhale 1 puff into the lungs as needed for shortness of breath.), Disp: 4 g, Rfl: 0   molnupiravir EUA (LAGEVRIO) 200 mg CAPS capsule, Take 4 capsules (800 mg total) by mouth 2 (two) times daily for 5 days., Disp: 40 capsule, Rfl: 0   ondansetron (ZOFRAN-ODT) 4 MG disintegrating tablet, Take 1 tablet (4 mg total) by mouth every 8 (eight) hours as needed for nausea or vomiting. (Patient taking differently: Take 8 mg by mouth daily as needed for nausea or vomiting.), Disp: 30 tablet, Rfl: 1   predniSONE (DELTASONE) 20 MG  tablet, Take 2 tablets (40 mg total) by mouth daily., Disp: 10 tablet, Rfl: 0   traMADol (ULTRAM) 50 MG tablet, Take 1 tablet (50 mg total) by mouth every 6 (six) hours as needed for moderate pain., Disp: 15 tablet, Rfl: 0   triamcinolone ointment (KENALOG) 0.5 %, Apply 1 Application topically 2 (two) times daily. (Patient not taking: Reported on 12/16/2021), Disp: 30 g, Rfl: 0  Observations/Objective: Patient is well-developed, well-nourished in no acute distress.  Resting comfortably  Head is normocephalic, atraumatic.  No labored breathing.  Speech is clear and coherent with logical content.  Patient is alert and oriented at baseline.    Assessment and Plan: 1. Acute bacterial sinusitis - azithromycin (ZITHROMAX) 250 MG tablet; Take 2 tablets on day 1, then 1 tablet daily on days 2 through 5  Dispense: 6 tablet; Refill: 0  - Worsening symptoms that have not responded to OTC medications.  - Will give Azithromycin - Continue allergy medications.  - Steam and humidifier can help - Stay well hydrated and get plenty of rest.  - Seek in person evaluation if no symptom improvement or if  symptoms worsen  Follow Up Instructions: I discussed the assessment and treatment plan with the patient. The patient was provided an opportunity to ask questions and all were answered. The patient agreed with the plan and demonstrated an understanding of the instructions.  A copy of instructions were sent to the patient via MyChart unless otherwise noted below.    The patient was advised to call back or seek an in-person evaluation if the symptoms worsen or if the condition fails to improve as anticipated.  Time:  I spent 8 minutes with the patient via telehealth technology discussing the above problems/concerns.    Gordon Daring, PA-C

## 2021-12-25 ENCOUNTER — Telehealth: Payer: BC Managed Care – PPO | Admitting: Nurse Practitioner

## 2021-12-25 ENCOUNTER — Telehealth: Payer: BC Managed Care – PPO

## 2021-12-25 DIAGNOSIS — B001 Herpesviral vesicular dermatitis: Secondary | ICD-10-CM

## 2021-12-25 MED ORDER — PENCICLOVIR 1 % EX CREA: 1.0000 | TOPICAL_CREAM | CUTANEOUS | 0 refills | Status: AC

## 2021-12-25 NOTE — Patient Instructions (Signed)
Danielle Dess, thank you for joining [name], NP for today's virtual visit.  While this provider is not your primary care provider (PCP), if your PCP is located in our provider database this encounter information will be shared with them immediately following your visit.  Consent: (Patient) Danielle Dess provided verbal consent for this virtual visit at the beginning of the encounter.  Current Medications:  Current Outpatient Medications:    penciclovir (DENAVIR) 1 % cream, Apply 1 Application topically every 2 (two) hours., Disp: 5 g, Rfl: 0   albuterol (VENTOLIN HFA) 108 (90 Base) MCG/ACT inhaler, Inhale 1-2 puffs into the lungs every 6 (six) hours as needed for wheezing or shortness of breath. (Patient taking differently: Inhale 2 puffs into the lungs daily as needed for wheezing or shortness of breath.), Disp: 18 g, Rfl: 1   azithromycin (ZITHROMAX) 250 MG tablet, Take 2 tablets on day 1, then 1 tablet daily on days 2 through 5, Disp: 6 tablet, Rfl: 0   bictegravir-emtricitabine-tenofovir AF (BIKTARVY) 50-200-25 MG TABS tablet, Take 1 tablet by mouth daily., Disp: , Rfl:    Brimonidine Tartrate (LUMIFY OP), Place 1 drop into both eyes daily., Disp: , Rfl:    diphenhydrAMINE (BENADRYL) 25 MG tablet, 1 to 2 tablets every 6 hours for itching and swelling (Patient taking differently: Take 50 mg by mouth at bedtime as needed for sleep.), Disp: 20 tablet, Rfl: 0   ipratropium (ATROVENT) 0.02 % nebulizer solution, Take 0.5 mg by nebulization as needed for shortness of breath or wheezing., Disp: , Rfl:    Ipratropium-Albuterol (COMBIVENT RESPIMAT) 20-100 MCG/ACT AERS respimat, Inhale 1 puff into the lungs every 6 (six) hours. (Patient taking differently: Inhale 1 puff into the lungs as needed for shortness of breath.), Disp: 4 g, Rfl: 0   molnupiravir EUA (LAGEVRIO) 200 mg CAPS capsule, Take 4 capsules (800 mg total) by mouth 2 (two) times daily for 5 days., Disp: 40 capsule, Rfl: 0   ondansetron  (ZOFRAN-ODT) 4 MG disintegrating tablet, Take 1 tablet (4 mg total) by mouth every 8 (eight) hours as needed for nausea or vomiting. (Patient taking differently: Take 8 mg by mouth daily as needed for nausea or vomiting.), Disp: 30 tablet, Rfl: 1   predniSONE (DELTASONE) 20 MG tablet, Take 2 tablets (40 mg total) by mouth daily., Disp: 10 tablet, Rfl: 0   traMADol (ULTRAM) 50 MG tablet, Take 1 tablet (50 mg total) by mouth every 6 (six) hours as needed for moderate pain., Disp: 15 tablet, Rfl: 0   triamcinolone ointment (KENALOG) 0.5 %, Apply 1 Application topically 2 (two) times daily. (Patient not taking: Reported on 12/16/2021), Disp: 30 g, Rfl: 0   Medications ordered in this encounter:  Meds ordered this encounter  Medications   penciclovir (DENAVIR) 1 % cream    Sig: Apply 1 Application topically every 2 (two) hours.    Dispense:  5 g    Refill:  0    Order Specific Question:   Supervising Provider    Answer:   Sabra Heck, [place]     *If you need refills on other medications prior to your next appointment, please contact your pharmacy*  Follow-Up: Call back or seek an in-person evaluation if the symptoms worsen or if the condition fails to improve as anticipated.  [facility] [phone]     If you have been instructed to have an in-person evaluation today at a local Urgent Care facility, please use the link below. It  will take you to a list of all of our available [facility], including address, phone number and hours of operation. Please do not delay care.  [facility]  If you or a family member do not have a primary care provider, use the link below to schedule a visit and establish care. When you choose a [facility] primary care physician or advanced practice provider, you gain a long-term partner in health. Find a Primary Care Provider  Learn more about [facility] in-office and virtual care options:  - Get Care Now

## 2021-12-25 NOTE — Progress Notes (Signed)
Virtual Visit Consent   Bharath Peek, you are scheduled for a virtual visit with a [facility] provider today. Just as with appointments in the office, your consent must be obtained to participate. Your consent will be active for this visit and any virtual visit you may have with one of our providers in the next 365 days. If you have a MyChart account, a copy of this consent can be sent to you electronically.  As this is a virtual visit, video technology does not allow for your provider to perform a traditional examination. This may limit your provider's ability to fully assess your condition. If your provider identifies any concerns that need to be evaluated in person or the need to arrange testing (such as labs, EKG, etc.), we will make arrangements to do so. Although advances in technology are sophisticated, we cannot ensure that it will always work on either your end or our end. If the connection with a video visit is poor, the visit may have to be switched to a telephone visit. With either a video or telephone visit, we are not always able to ensure that we have a secure connection.  By engaging in this virtual visit, you consent to the provision of healthcare and authorize for your insurance to be billed (if applicable) for the services provided during this visit. Depending on your insurance coverage, you may receive a charge related to this service.  I need to obtain your verbal consent now. Are you willing to proceed with your visit today? Gordon Turner has provided verbal consent on 12/25/2021 for a virtual visit (video or telephone). [name], NP  Date: 12/25/2021 3:38 PM  Virtual Visit via Video Note   I, [name], connected with  Gordon Turner  ([id number], May 08, 1985) on 12/25/21 at  3:30 PM EDT by a video-enabled telemedicine application and verified that I am speaking with the correct person using two identifiers.  Location: Patient: Virtual Visit Location Patient:  Home Provider: Virtual Visit Location Provider: Home Office   I discussed the limitations of evaluation and management by telemedicine and the availability of in person appointments. The patient expressed understanding and agreed to proceed.    History of Present Illness: Gordon Turner is a 37 y.o. who identifies as a male who was assigned male at birth, and is being seen today for cold sore.  Currently taking molnupiravir and zpak for COVID and bacterial sinusitis. 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   Healthcare maintenance 09/22/2021   SBO (small bowel obstruction) ([place]) 03/31/2018   Celiac disease 10/20/2016   Moderate persistent asthma with exacerbation 05/12/2016   Depressive disorder 12/26/2013   Allergic rhinitis due to pollen 09/25/2013   Insomnia 09/25/2013   Panic disorder without agoraphobia 09/25/2013   Chronic sinusitis 04/04/2013   Human immunodeficiency virus (HIV) disease ([place]) 09/27/2011    Allergies:  Allergies  Allergen Reactions   Amoxicillin Anaphylaxis   Penicillins Anaphylaxis, Hives, Swelling and Other (See Comments)    DID THE REACTION INVOLVE: Swelling of the face/tongue/throat, SOB, or low BP? No Sudden or severe rash/hives, skin peeling, or the inside of the mouth or nose? No Did it require medical treatment? No When did it last happen?     baby If all above answers are "NO", may proceed with  cephalosporin use.   Medications:  Current Outpatient Medications:    penciclovir (DENAVIR) 1 % cream, Apply 1 Application topically every 2 (two) hours., Disp: 5 g, Rfl: 0   albuterol (VENTOLIN HFA) 108 (90 Base) MCG/ACT inhaler, Inhale 1-2 puffs into the lungs every 6 (six) hours as needed for wheezing or shortness of breath. (Patient taking differently: Inhale 2 puffs into  the lungs daily as needed for wheezing or shortness of breath.), Disp: 18 g, Rfl: 1   azithromycin (ZITHROMAX) 250 MG tablet, Take 2 tablets on day 1, then 1 tablet daily on days 2 through 5, Disp: 6 tablet, Rfl: 0   bictegravir-emtricitabine-tenofovir AF (BIKTARVY) 50-200-25 MG TABS tablet, Take 1 tablet by mouth daily., Disp: , Rfl:    Brimonidine Tartrate (LUMIFY OP), Place 1 drop into both eyes daily., Disp: , Rfl:    diphenhydrAMINE (BENADRYL) 25 MG tablet, 1 to 2 tablets every 6 hours for itching and swelling (Patient taking differently: Take 50 mg by mouth at bedtime as needed for sleep.), Disp: 20 tablet, Rfl: 0   ipratropium (ATROVENT) 0.02 % nebulizer solution, Take 0.5 mg by nebulization as needed for shortness of breath or wheezing., Disp: , Rfl:    Ipratropium-Albuterol (COMBIVENT RESPIMAT) 20-100 MCG/ACT AERS respimat, Inhale 1 puff into the lungs every 6 (six) hours. (Patient taking differently: Inhale 1 puff into the lungs as needed for shortness of breath.), Disp: 4 g, Rfl: 0   molnupiravir EUA (LAGEVRIO) 200 mg CAPS capsule, Take 4 capsules (800 mg total) by mouth 2 (two) times daily for 5 days., Disp: 40 capsule, Rfl: 0   ondansetron (ZOFRAN-ODT) 4 MG disintegrating tablet, Take 1 tablet (4 mg total) by mouth every 8 (eight) hours as needed for nausea or vomiting. (Patient taking differently: Take 8 mg by mouth daily as needed for nausea or vomiting.), Disp: 30 tablet, Rfl: 1   predniSONE (DELTASONE) 20 MG tablet, Take 2 tablets (40 mg total) by mouth daily., Disp: 10 tablet, Rfl: 0   traMADol (ULTRAM) 50 MG tablet, Take 1 tablet (50 mg total) by mouth every 6 (six) hours as needed for moderate pain., Disp: 15 tablet, Rfl: 0   triamcinolone ointment (KENALOG) 0.5 %, Apply 1 Application topically 2 (two) times daily. (Patient not taking: Reported on 12/16/2021), Disp: 30 g, Rfl: 0  Observations/Objective: Patient is well-developed, well-nourished in no acute distress.  Resting  comfortably  at home.  Head is normocephalic, atraumatic.  No labored breathing.  Speech is clear and coherent with logical content.  Patient is alert and oriented at baseline.    Assessment and Plan: 1. Herpes labialis - penciclovir (DENAVIR) 1 % cream; Apply 1 Application topically every 2 (two) hours.  Dispense: 5 g; Refill: 0    Follow Up Instructions: I discussed the assessment and treatment plan with the patient. The patient was provided an opportunity to ask questions and all were answered. The patient agreed with the plan and demonstrated an understanding of the instructions.  A copy of instructions were sent to the patient via MyChart unless otherwise noted below.   The patient was advised to call back or seek an in-person evaluation if the symptoms worsen or if the condition fails to improve as anticipated.  Time:  I spent 11 minutes with the patient via telehealth technology discussing the above problems/concerns.    [name], NP

## 2022-01-03 DIAGNOSIS — Z789 Other specified health status: Secondary | ICD-10-CM | POA: Diagnosis not present

## 2022-01-03 DIAGNOSIS — Z03818 Encounter for observation for suspected exposure to other biological agents ruled out: Secondary | ICD-10-CM | POA: Diagnosis not present

## 2022-01-11 ENCOUNTER — Other Ambulatory Visit: Payer: Self-pay | Admitting: Infectious Diseases

## 2022-01-11 ENCOUNTER — Telehealth: Payer: BC Managed Care – PPO | Admitting: Nurse Practitioner

## 2022-01-11 DIAGNOSIS — J4521 Mild intermittent asthma with (acute) exacerbation: Secondary | ICD-10-CM | POA: Diagnosis not present

## 2022-01-11 MED ORDER — ALBUTEROL SULFATE HFA 108 (90 BASE) MCG/ACT IN AERS: 2.0000 | INHALATION_SPRAY | Freq: Every day | RESPIRATORY_TRACT | 0 refills | Status: DC | PRN

## 2022-01-11 NOTE — Progress Notes (Signed)
Virtual Visit Consent   Emory [name], you are scheduled for a virtual visit with [name], [place], a [facility] provider, today.     Just as with appointments in the office, your consent must be obtained to participate.  Your consent will be active for this visit and any virtual visit you may have with one of our providers in the next 365 days.     If you have a MyChart account, a copy of this consent can be sent to you electronically.  All virtual visits are billed to your insurance company just like a traditional visit in the office.    As this is a virtual visit, video technology does not allow for your provider to perform a traditional examination.  This may limit your provider's ability to fully assess your condition.  If your provider identifies any concerns that need to be evaluated in person or the need to arrange testing (such as labs, EKG, etc.), we will make arrangements to do so.     Although advances in technology are sophisticated, we cannot ensure that it will always work on either your end or our end.  If the connection with a video visit is poor, the visit may have to be switched to a telephone visit.  With either a video or telephone visit, we are not always able to ensure that we have a secure connection.     I need to obtain your verbal consent now.   Are you willing to proceed with your visit today? YES   Jaquari Reckner has provided verbal consent on 01/11/2022 for a virtual visit (video or telephone).   [name], FNP   Date: 01/11/2022 7:17 PM   Virtual Visit via Video Note   I, [name], connected with [name] ([id number], 1985-04-25) on 01/11/22 at  7:30 PM EDT by a video-enabled telemedicine application and verified that I am speaking with the correct person using two identifiers.  Location: Patient: Virtual Visit Location Patient: Home Provider: Virtual Visit Location Provider: Mobile   I discussed the limitations of  evaluation and management by telemedicine and the availability of in person appointments. The patient expressed understanding and agreed to proceed.    History of Present Illness: Gordon Turner is a 36 y.o. who identifies as a male who was assigned male at birth, and is being seen today for asthma.  HPI: Patient has been seen in video visit 3x since 12/24/21. He has been dx with sinusits and herpes labialis. Today he is complaining of asthma symptoms. He is on atrovent and conbivent as needed. He is now having an asthma flare up. He is almost out of his albuterol inhaler. He says he does not feel that he needs steroids at this point.    Review of Systems  Constitutional:  Negative for chills and fever.  Respiratory:  Positive for cough and shortness of breath.     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   Healthcare maintenance 09/22/2021   SBO (small bowel obstruction) ([place]) 03/31/2018   Celiac disease 10/20/2016   Moderate persistent asthma with exacerbation 05/12/2016   Depressive disorder 12/26/2013   Allergic rhinitis due to pollen 09/25/2013   Insomnia 09/25/2013   Panic disorder without agoraphobia 09/25/2013   Chronic sinusitis 04/04/2013   Human immunodeficiency virus (HIV) disease ([place]) 09/27/2011    Allergies:  Allergies  Allergen  Reactions   Amoxicillin Anaphylaxis   Penicillins Anaphylaxis, Hives, Swelling and Other (See Comments)    DID THE REACTION INVOLVE: Swelling of the face/tongue/throat, SOB, or low BP? No Sudden or severe rash/hives, skin peeling, or the inside of the mouth or nose? No Did it require medical treatment? No When did it last happen?     baby If all above answers are "NO", may proceed with cephalosporin use.   Medications:  Current Outpatient Medications:    albuterol (VENTOLIN HFA) 108 (90 Base) MCG/ACT inhaler, Inhale 1-2 puffs into  the lungs every 6 (six) hours as needed for wheezing or shortness of breath. (Patient taking differently: Inhale 2 puffs into the lungs daily as needed for wheezing or shortness of breath.), Disp: 18 g, Rfl: 1   bictegravir-emtricitabine-tenofovir AF (BIKTARVY) 50-200-25 MG TABS tablet, Take 1 tablet by mouth daily., Disp: , Rfl:    Brimonidine Tartrate (LUMIFY OP), Place 1 drop into both eyes daily., Disp: , Rfl:    diphenhydrAMINE (BENADRYL) 25 MG tablet, 1 to 2 tablets every 6 hours for itching and swelling (Patient taking differently: Take 50 mg by mouth at bedtime as needed for sleep.), Disp: 20 tablet, Rfl: 0   ipratropium (ATROVENT) 0.02 % nebulizer solution, Take 0.5 mg by nebulization as needed for shortness of breath or wheezing., Disp: , Rfl:    Ipratropium-Albuterol (COMBIVENT RESPIMAT) 20-100 MCG/ACT AERS respimat, Inhale 1 puff into the lungs every 6 (six) hours. (Patient taking differently: Inhale 1 puff into the lungs as needed for shortness of breath.), Disp: 4 g, Rfl: 0   ondansetron (ZOFRAN-ODT) 4 MG disintegrating tablet, Take 1 tablet (4 mg total) by mouth every 8 (eight) hours as needed for nausea or vomiting. (Patient taking differently: Take 8 mg by mouth daily as needed for nausea or vomiting.), Disp: 30 tablet, Rfl: 1   penciclovir (DENAVIR) 1 % cream, Apply 1 Application topically every 2 (two) hours., Disp: 5 g, Rfl: 0   predniSONE (DELTASONE) 20 MG tablet, Take 2 tablets (40 mg total) by mouth daily., Disp: 10 tablet, Rfl: 0   traMADol (ULTRAM) 50 MG tablet, Take 1 tablet (50 mg total) by mouth every 6 (six) hours as needed for moderate pain., Disp: 15 tablet, Rfl: 0   triamcinolone ointment (KENALOG) 0.5 %, Apply 1 Application topically 2 (two) times daily. (Patient not taking: Reported on 12/16/2021), Disp: 30 g, Rfl: 0  Observations/Objective: Patient is well-developed, well-nourished in no acute distress.  Resting comfortably  at home.  Head is normocephalic, traumatic.   No labored breathing.  Speech is clear and coherent with logical content.  Patient is alert and oriented at baseline.  Voice hoarse  Assessment and Plan:  Blanchie Dessert in today with chief complaint of No chief complaint on file.   1. Mild intermittent asthma with exacerbation Rest Cough meds OTC as needed If develop SOB will need to be seen  Meds ordered this encounter  Medications   albuterol (VENTOLIN HFA) 108 (90 Base) MCG/ACT inhaler    Sig: Inhale 2 puffs into the lungs daily as needed for wheezing or shortness of breath.    Dispense:  18 g    Refill:  0    Order Specific Question:   Supervising Provider    Answer:   Merrilee Jansky [id number]      Follow Up Instructions: I discussed the assessment and treatment plan with the patient. The patient was provided an opportunity to ask questions and all were answered. The patient  agreed with the plan and demonstrated an understanding of the instructions.  A copy of instructions were sent to the patient via MyChart.  The patient was advised to call back or seek an in-person evaluation if the symptoms worsen or if the condition fails to improve as anticipated.  Time:  I spent 13 minutes with the patient via telehealth technology discussing the above problems/concerns.    Mary-Margaret Daphine Deutscher, FNP

## 2022-01-11 NOTE — Patient Instructions (Signed)
Asthma, Adult  Asthma is a condition that causes swelling and narrowing of the airways. These are the passages that lead from the nose and mouth down into the lungs. When asthma symptoms get worse it is called an asthma attack or flare. This can make it hard to breathe. Asthma flares can range from minor to life-threatening. There is no cure for asthma, but medicines and lifestyle changes can help to control it. What are the causes? It is not known exactly what causes asthma, but certain things can cause asthma symptoms to get worse (triggers). What can trigger an asthma attack? Cigarette smoke. Mold. Dust. Your pet's skin flakes (dander). Cockroaches. Pollen. Air pollution (like household cleaners, wood smoke, smog, or chemical odors). What are the signs or symptoms? Trouble breathing (shortness of breath). Coughing. Making high-pitched whistling sounds when you breathe, most often when you breathe out (wheezing). Chest tightness. Tiredness with little activity. Poor exercise tolerance. How is this treated? Controller medicines that help prevent asthma symptoms. Fast-acting reliever or rescue medicines. These give short-term relief of asthma symptoms. Allergy medicines if your attacks are brought on by allergens. Medicines to help control the body's defense (immune) system. Staying away from the things that cause asthma attacks. Follow these instructions at home: Avoiding triggers in your home Do not allow anyone to smoke in your home. Limit use of fireplaces and wood stoves. Get rid of pests (such as roaches and mice) and their droppings. Keep your home clean. Clean your floors. Dust regularly. Use cleaning products that do not smell. Wash bed sheets and blankets every week in hot water. Dry them in a dryer. Have someone vacuum when you are not home. Change your heating and air conditioning filters often. Use blankets that are made of polyester or cotton. General  instructions Take over-the-counter and prescription medicines only as told by your doctor. Do not smoke or use any products that contain nicotine or tobacco. If you need help quitting, ask your doctor. Stay away from secondhand smoke. Avoid doing things outdoors when allergen counts are high and when air quality is low. Warm up before you exercise. Take time to cool down after exercise. Use a peak flow meter as told by your doctor. A peak flow meter is a tool that measures how well your lungs are working. Keep track of the peak flow meter's readings. Write them down. Follow your asthma action plan. This is a written plan for taking care of your asthma and treating your attacks. Make sure you get all the shots (vaccines) that your doctor recommends. Ask your doctor about a flu shot and a pneumonia shot. Keep all follow-up visits. Contact a doctor if: You have wheezing, shortness of breath, or a cough even while taking medicine to prevent attacks. The mucus you cough up (sputum) is thicker than usual. The mucus you cough up changes from clear or white to yellow, [place], gray, or is bloody. You have problems from the medicine you are taking, such as: A rash. Itching. Swelling. Trouble breathing. You need reliever medicines more than 2-3 times a week. Your peak flow reading is still at 50-79% of your personal best after following the action plan for 1 hour. You have a fever. Get help right away if: You seem to be worse and are not responding to medicine during an asthma attack. You are short of breath even at rest. You get short of breath when doing very little activity. You have trouble eating, drinking, or talking. You have chest   pain or tightness. You have a fast heartbeat. Your lips or fingernails start to turn blue. You are light-headed or dizzy, or you faint. Your peak flow is less than 50% of your personal best. You feel too tired to breathe normally. These symptoms may be an  emergency. Get help right away. Call 911. Do not wait to see if the symptoms will go away. Do not drive yourself to the hospital. Summary Asthma is a long-term (chronic) condition in which the airways get tight and narrow. An asthma attack can make it hard to breathe. Asthma cannot be cured, but medicines and lifestyle changes can help control it. Make sure you understand how to avoid triggers and how and when to use your medicines. Avoid things that can cause allergy symptoms (allergens). These include animal skin flakes (dander) and pollen from trees or grass. Avoid things that pollute the air. These may include household cleaners, wood smoke, smog, or chemical odors. This information is not intended to replace advice given to you by your health care provider. Make sure you discuss any questions you have with your health care provider. Document Revised: 12/21/2020 Document Reviewed: 12/21/2020 Elsevier Patient Education  2023 [employer]  

## 2022-01-13 ENCOUNTER — Ambulatory Visit: Payer: BC Managed Care – PPO | Admitting: Infectious Diseases

## 2022-01-13 NOTE — Telephone Encounter (Signed)
Appointment 10/19

## 2022-01-19 ENCOUNTER — Telehealth: Payer: BC Managed Care – PPO | Admitting: Nurse Practitioner

## 2022-01-19 DIAGNOSIS — B001 Herpesviral vesicular dermatitis: Secondary | ICD-10-CM | POA: Diagnosis not present

## 2022-01-19 MED ORDER — VALACYCLOVIR HCL 1 G PO TABS: ORAL_TABLET | ORAL | 0 refills | Status: AC

## 2022-01-19 NOTE — Patient Instructions (Signed)
Danielle Dess, thank you for joining Chevis Pretty, FNP for today's virtual visit.  While this provider is not your primary care provider (PCP), if your PCP is located in our provider database this encounter information will be shared with them immediately following your visit.   [facility] account gives you access to today's visit and all your visits, tests, and labs performed at [facility] " click here if you don't have a [facility] account or go to [email or web address]  Consent: (Patient) Danielle Dess provided verbal consent for this virtual visit at the beginning of the encounter.  Current Medications:  Current Outpatient Medications:    valACYclovir (VALTREX) 1000 MG tablet, 2 tablets now and repeat in 12 hours., Disp: 8 tablet, Rfl: 0   albuterol (VENTOLIN HFA) 108 (90 Base) MCG/ACT inhaler, Inhale 2 puffs into the lungs daily as needed for wheezing or shortness of breath., Disp: 18 g, Rfl: 0   bictegravir-emtricitabine-tenofovir AF (BIKTARVY) 50-200-25 MG TABS tablet, Take 1 tablet by mouth daily., Disp: , Rfl:    Brimonidine Tartrate (LUMIFY OP), Place 1 drop into both eyes daily., Disp: , Rfl:    diphenhydrAMINE (BENADRYL) 25 MG tablet, 1 to 2 tablets every 6 hours for itching and swelling (Patient taking differently: Take 50 mg by mouth at bedtime as needed for sleep.), Disp: 20 tablet, Rfl: 0   ipratropium (ATROVENT) 0.02 % nebulizer solution, Take 0.5 mg by nebulization as needed for shortness of breath or wheezing., Disp: , Rfl:    Ipratropium-Albuterol (COMBIVENT RESPIMAT) 20-100 MCG/ACT AERS respimat, Inhale 1 puff into the lungs every 6 (six) hours. (Patient taking differently: Inhale 1 puff into the lungs as needed for shortness of breath.), Disp: 4 g, Rfl: 0   ondansetron (ZOFRAN-ODT) 4 MG disintegrating tablet, Take 1 tablet (4 mg total) by mouth every 8 (eight) hours as needed for nausea or vomiting. (Patient taking differently: Take 8  mg by mouth daily as needed for nausea or vomiting.), Disp: 30 tablet, Rfl: 1   penciclovir (DENAVIR) 1 % cream, Apply 1 Application topically every 2 (two) hours., Disp: 5 g, Rfl: 0   predniSONE (DELTASONE) 20 MG tablet, Take 2 tablets (40 mg total) by mouth daily., Disp: 10 tablet, Rfl: 0   traMADol (ULTRAM) 50 MG tablet, Take 1 tablet (50 mg total) by mouth every 6 (six) hours as needed for moderate pain., Disp: 15 tablet, Rfl: 0   triamcinolone ointment (KENALOG) 0.5 %, Apply 1 Application topically 2 (two) times daily. (Patient not taking: Reported on 12/16/2021), Disp: 30 g, Rfl: 0   Medications ordered in this encounter:  Meds ordered this encounter  Medications   valACYclovir (VALTREX) 1000 MG tablet    Sig: 2 tablets now and repeat in 12 hours.    Dispense:  8 tablet    Refill:  0    Order Specific Question:   Supervising Provider    Answer:   Chase Picket [id number]     *If you need refills on other medications prior to your next appointment, please contact your pharmacy*  Follow-Up: Call back or seek an in-person evaluation if the symptoms worsen or if the condition fails to improve as anticipated.  [place] [phone]  Other Instructions none  If you have been instructed to have an in-person evaluation today at a local Urgent Care facility, please use the link below. It will take you to a list of all of our available [facility], including  address, phone number and hours of operation. Please do not delay care.  [facility]  If you or a family member do not have a primary care provider, use the link below to schedule a visit and establish care. When you choose a [facility] primary care physician or advanced practice provider, you gain a long-term partner in health. Find a Primary Care Provider  Learn more about [facility]'s in-office and virtual care options: [facility]

## 2022-01-19 NOTE — Progress Notes (Signed)
Virtual Visit Consent   Gordon Turner, you are scheduled for a virtual visit with Mary-Margaret Daphine Deutscher, FNP, a [facility] provider, today.     Just as with appointments in the office, your consent must be obtained to participate.  Your consent will be active for this visit and any virtual visit you may have with one of our providers in the next 365 days.     If you have a MyChart account, a copy of this consent can be sent to you electronically.  All virtual visits are billed to your insurance company just like a traditional visit in the office.    As this is a virtual visit, video technology does not allow for your provider to perform a traditional examination.  This may limit your provider's ability to fully assess your condition.  If your provider identifies any concerns that need to be evaluated in person or the need to arrange testing (such as labs, EKG, etc.), we will make arrangements to do so.     Although advances in technology are sophisticated, we cannot ensure that it will always work on either your end or our end.  If the connection with a video visit is poor, the visit may have to be switched to a telephone visit.  With either a video or telephone visit, we are not always able to ensure that we have a secure connection.     I need to obtain your verbal consent now.   Are you willing to proceed with your visit today? YES   Gordon Turner has provided verbal consent on 01/19/2022 for a virtual visit (video or telephone).   Mary-Margaret Daphine Deutscher, FNP   Date: 01/19/2022 2:30 PM   Virtual Visit via Video Note   I, Mary-Margaret Daphine Deutscher, connected with Gordon Turner ([id number], 1985-11-10) on 01/19/22 at  2:30 PM EDT by a video-enabled telemedicine application and verified that I am speaking with the correct person using two identifiers.  Location: Patient: Virtual Visit Location Patient: Mobile Provider: Virtual Visit Location Provider: Mobile   I discussed the limitations of  evaluation and management by telemedicine and the availability of in person appointments. The patient expressed understanding and agreed to proceed.    History of Present Illness: Gordon Turner is a 36 y.o. who identifies as a male who was assigned male at birth, and is being seen today for cold sore.  HPI: Patient developed a cold sore 2 weeks ago. He was prescribed an ointment. The ointment has been on back order and has not been able to get. When he did original visit he also had covid and was not allowed to take valtrex with molnupvir. He has finished the molnupvir and now has new cold sore on lip and would like sme valtrex.    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   Healthcare maintenance 09/22/2021   SBO (small bowel obstruction) (HCC) 03/31/2018   Celiac disease 10/20/2016   Moderate persistent asthma with exacerbation 05/12/2016   Depressive disorder 12/26/2013   Allergic rhinitis due to pollen 09/25/2013   Insomnia 09/25/2013   Panic disorder without agoraphobia 09/25/2013   Chronic sinusitis 04/04/2013   Human immunodeficiency virus (HIV) disease (HCC) 09/27/2011    Allergies:  Allergies  Allergen Reactions   Amoxicillin Anaphylaxis   Penicillins Anaphylaxis, Hives, Swelling and Other (See Comments)    DID THE REACTION INVOLVE:  Swelling of the face/tongue/throat, SOB, or low BP? No Sudden or severe rash/hives, skin peeling, or the inside of the mouth or nose? No Did it require medical treatment? No When did it last happen?     baby If all above answers are "NO", may proceed with cephalosporin use.   Medications:  Current Outpatient Medications:    albuterol (VENTOLIN HFA) 108 (90 Base) MCG/ACT inhaler, Inhale 2 puffs into the lungs daily as needed for wheezing or shortness of breath., Disp: 18 g, Rfl: 0   bictegravir-emtricitabine-tenofovir AF  (BIKTARVY) 50-200-25 MG TABS tablet, Take 1 tablet by mouth daily., Disp: , Rfl:    Brimonidine Tartrate (LUMIFY OP), Place 1 drop into both eyes daily., Disp: , Rfl:    diphenhydrAMINE (BENADRYL) 25 MG tablet, 1 to 2 tablets every 6 hours for itching and swelling (Patient taking differently: Take 50 mg by mouth at bedtime as needed for sleep.), Disp: 20 tablet, Rfl: 0   ipratropium (ATROVENT) 0.02 % nebulizer solution, Take 0.5 mg by nebulization as needed for shortness of breath or wheezing., Disp: , Rfl:    Ipratropium-Albuterol (COMBIVENT RESPIMAT) 20-100 MCG/ACT AERS respimat, Inhale 1 puff into the lungs every 6 (six) hours. (Patient taking differently: Inhale 1 puff into the lungs as needed for shortness of breath.), Disp: 4 g, Rfl: 0   ondansetron (ZOFRAN-ODT) 4 MG disintegrating tablet, Take 1 tablet (4 mg total) by mouth every 8 (eight) hours as needed for nausea or vomiting. (Patient taking differently: Take 8 mg by mouth daily as needed for nausea or vomiting.), Disp: 30 tablet, Rfl: 1   penciclovir (DENAVIR) 1 % cream, Apply 1 Application topically every 2 (two) hours., Disp: 5 g, Rfl: 0   predniSONE (DELTASONE) 20 MG tablet, Take 2 tablets (40 mg total) by mouth daily., Disp: 10 tablet, Rfl: 0   traMADol (ULTRAM) 50 MG tablet, Take 1 tablet (50 mg total) by mouth every 6 (six) hours as needed for moderate pain., Disp: 15 tablet, Rfl: 0   triamcinolone ointment (KENALOG) 0.5 %, Apply 1 Application topically 2 (two) times daily. (Patient not taking: Reported on 12/16/2021), Disp: 30 g, Rfl: 0  Observations/Objective: Patient is well-developed, well-nourished in no acute distress.  Resting comfortably  at home.  Head is normocephalic, atraumatic.  No labored breathing.  Speech is clear and coherent with logical content.  Patient is alert and oriented at baseline.  Vesicular lesion on right upper lip in corner.  Assessment and Plan:  Gordon Turner in today with chief complaint of Mouth  Lesions   1. Cold sore Avoid rubbing Meds ordered this encounter  Medications   valACYclovir (VALTREX) 1000 MG tablet    Sig: 2 tablets now and repeat in 12 hours.    Dispense:  8 tablet    Refill:  0    Order Specific Question:   Supervising Provider    Answer:   Chase Picket [id number]        Follow Up Instructions: I discussed the assessment and treatment plan with the patient. The patient was provided an opportunity to ask questions and all were answered. The patient agreed with the plan and demonstrated an understanding of the instructions.  A copy of instructions were sent to the patient via MyChart.  The patient was advised to call back or seek an in-person evaluation if the symptoms worsen or if the condition fails to improve as anticipated.  Time:  I spent 6 minutes with the patient via telehealth technology discussing the  above problems/concerns.    Mary-Margaret Daphine Deutscher, FNP

## 2022-01-28 ENCOUNTER — Ambulatory Visit: Payer: BC Managed Care – PPO | Admitting: Family

## 2022-02-07 ENCOUNTER — Telehealth: Payer: Self-pay

## 2022-02-07 NOTE — Telephone Encounter (Signed)
Patient with detectable viral load and needs appointment, called to schedule. No answer and voicemail box full.  Sandie Ano, RN

## 2022-02-25 NOTE — Telephone Encounter (Signed)
Patient currently not scheduled for appointment with noted detectable viral load. Called patient and left HIPAA compliant voicemail to return [facility] office call.  Valarie Cones, LPN

## 2022-03-18 ENCOUNTER — Telehealth: Payer: BC Managed Care – PPO | Admitting: Family Medicine

## 2022-03-18 DIAGNOSIS — F419 Anxiety disorder, unspecified: Secondary | ICD-10-CM | POA: Diagnosis not present

## 2022-03-18 DIAGNOSIS — J4541 Moderate persistent asthma with (acute) exacerbation: Secondary | ICD-10-CM

## 2022-03-18 MED ORDER — ALBUTEROL SULFATE HFA 108 (90 BASE) MCG/ACT IN AERS: 2.0000 | INHALATION_SPRAY | Freq: Every day | RESPIRATORY_TRACT | 0 refills | Status: DC | PRN

## 2022-03-18 NOTE — Progress Notes (Signed)
Virtual Visit Consent   Gordon Turner, you are scheduled for a virtual visit with a [facility] provider today. Just as with appointments in the office, your consent must be obtained to participate. Your consent will be active for this visit and any virtual visit you may have with one of our providers in the next 365 days. If you have a MyChart account, a copy of this consent can be sent to you electronically.  As this is a virtual visit, video technology does not allow for your provider to perform a traditional examination. This may limit your provider's ability to fully assess your condition. If your provider identifies any concerns that need to be evaluated in person or the need to arrange testing (such as labs, EKG, etc.), we will make arrangements to do so. Although advances in technology are sophisticated, we cannot ensure that it will always work on either your end or our end. If the connection with a video visit is poor, the visit may have to be switched to a telephone visit. With either a video or telephone visit, we are not always able to ensure that we have a secure connection.  By engaging in this virtual visit, you consent to the provision of healthcare and authorize for your insurance to be billed (if applicable) for the services provided during this visit. Depending on your insurance coverage, you may receive a charge related to this service.  I need to obtain your verbal consent now. Are you willing to proceed with your visit today? Gordon Turner has provided verbal consent on 03/18/2022 for a virtual visit (video or telephone). [name], FNP  Date: 03/18/2022 4:11 PM  Virtual Visit via Video Note   I, [name], connected with  Gordon Turner  ([id number], 1985/12/05) on 03/18/22 at  4:00 PM EST by a video-enabled telemedicine application and verified that I am speaking with the correct person using two identifiers.  Location: Patient: Virtual Visit Location Patient: Home Provider:  Virtual Visit Location Provider: Home Office   I discussed the limitations of evaluation and management by telemedicine and the availability of in person appointments. The patient expressed understanding and agreed to proceed.    History of Present Illness: Gordon Turner is a 36 y.o. who identifies as a male who was assigned male at birth, and is being seen today for asthma and anxiety flare. He reports he is having a flare of asthma. He needs a refill on albuterol. He also has anxiety and was previously on sertraline and xanax. He will go to urgent care for the anxiety meds.   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   Healthcare maintenance 09/22/2021   SBO (small bowel obstruction) (HCC) 03/31/2018   Celiac disease 10/20/2016   Moderate persistent asthma with exacerbation 05/12/2016   Depressive disorder 12/26/2013   Allergic rhinitis due to pollen 09/25/2013   Insomnia 09/25/2013   Panic disorder without agoraphobia 09/25/2013   Chronic sinusitis 04/04/2013   Human immunodeficiency virus (HIV) disease (HCC) 09/27/2011    Allergies:  Allergies  Allergen Reactions   Amoxicillin Anaphylaxis   Penicillins Anaphylaxis, Hives, Swelling and Other (See Comments)    DID THE REACTION INVOLVE: Swelling of the face/tongue/throat, SOB, or low BP? No Sudden or severe rash/hives, skin peeling, or the inside of the mouth or nose? No Did it require medical treatment? No When did  it last happen?     baby If all above answers are "NO", may proceed with cephalosporin use.   Medications:  Current Outpatient Medications:    albuterol (VENTOLIN HFA) 108 (90 Base) MCG/ACT inhaler, Inhale 2 puffs into the lungs daily as needed for wheezing or shortness of breath., Disp: 18 g, Rfl: 0   bictegravir-emtricitabine-tenofovir AF (BIKTARVY) 50-200-25 MG TABS tablet, Take 1 tablet by mouth  daily., Disp: , Rfl:    Brimonidine Tartrate (LUMIFY OP), Place 1 drop into both eyes daily., Disp: , Rfl:    diphenhydrAMINE (BENADRYL) 25 MG tablet, 1 to 2 tablets every 6 hours for itching and swelling (Patient taking differently: Take 50 mg by mouth at bedtime as needed for sleep.), Disp: 20 tablet, Rfl: 0   ipratropium (ATROVENT) 0.02 % nebulizer solution, Take 0.5 mg by nebulization as needed for shortness of breath or wheezing., Disp: , Rfl:    Ipratropium-Albuterol (COMBIVENT RESPIMAT) 20-100 MCG/ACT AERS respimat, Inhale 1 puff into the lungs every 6 (six) hours. (Patient taking differently: Inhale 1 puff into the lungs as needed for shortness of breath.), Disp: 4 g, Rfl: 0   ondansetron (ZOFRAN-ODT) 4 MG disintegrating tablet, Take 1 tablet (4 mg total) by mouth every 8 (eight) hours as needed for nausea or vomiting. (Patient taking differently: Take 8 mg by mouth daily as needed for nausea or vomiting.), Disp: 30 tablet, Rfl: 1   penciclovir (DENAVIR) 1 % cream, Apply 1 Application topically every 2 (two) hours., Disp: 5 g, Rfl: 0   predniSONE (DELTASONE) 20 MG tablet, Take 2 tablets (40 mg total) by mouth daily., Disp: 10 tablet, Rfl: 0   traMADol (ULTRAM) 50 MG tablet, Take 1 tablet (50 mg total) by mouth every 6 (six) hours as needed for moderate pain., Disp: 15 tablet, Rfl: 0   triamcinolone ointment (KENALOG) 0.5 %, Apply 1 Application topically 2 (two) times daily. (Patient not taking: Reported on 12/16/2021), Disp: 30 g, Rfl: 0   valACYclovir (VALTREX) 1000 MG tablet, 2 tablets now and repeat in 12 hours., Disp: 8 tablet, Rfl: 0  Observations/Objective: Patient is well-developed, well-nourished in no acute distress.  Resting comfortably  at home.  Head is normocephalic, atraumatic.  No labored breathing.  Speech is clear and coherent with logical content.  Patient is alert and oriented at baseline.    Assessment and Plan: 1. Moderate persistent asthma with  exacerbation Albuterol refilled. Urgent care for anxiety meds, Instructed to go to urgent care for worsening in asthma.   Follow Up Instructions: I discussed the assessment and treatment plan with the patient. The patient was provided an opportunity to ask questions and all were answered. The patient agreed with the plan and demonstrated an understanding of the instructions.  A copy of instructions were sent to the patient via MyChart unless otherwise noted below.     The patient was advised to call back or seek an in-person evaluation if the symptoms worsen or if the condition fails to improve as anticipated.  Time:  I spent 10 minutes with the patient via telehealth technology discussing the above problems/concerns.    [name], FNP

## 2022-03-18 NOTE — Patient Instructions (Signed)
Asthma, Adult ? ?Asthma is a long-term (chronic) condition that causes recurrent episodes in which the lower airways in the lungs become tight and narrow. The narrowing is caused by inflammation and tightening of the smooth muscle around the lower airways. ?Asthma episodes, also called asthma attacks or asthma flares, may cause coughing, making high-pitched whistling sounds when you breathe, most often when you breathe out (wheezing), shortness of breath, and chest pain. The airways may produce extra mucus caused by the inflammation and irritation. During an attack, it can be difficult to breathe. Asthma attacks can range from minor to life-threatening. ?Asthma cannot be cured, but medicines and lifestyle changes can help control it and treat acute attacks. It is important to keep your asthma well controlled so the condition does not interfere with your daily life. ?What are the causes? ?This condition is believed to be caused by inherited (genetic) and environmental factors, but its exact cause is not known. ?What can trigger an asthma attack? ?Many things can bring on an asthma attack or make symptoms worse. These triggers are different for every person. Common triggers include: ?Allergens and irritants like mold, dust, pet dander, cockroaches, pollen, air pollution, and chemical odors. ?Cigarette smoke. ?Weather changes and cold air. ?Stress and strong emotional responses such as crying or laughing hard. ?Certain medications such as aspirin or beta blockers. ?Infections and inflammatory conditions, such as the flu, a cold, pneumonia, or inflammation of the nasal membranes (rhinitis). ?Gastroesophageal reflux disease (GERD). ?What are the signs or symptoms? ?Symptoms may occur right after exposure to an asthma trigger or hours later and can vary by person. Common signs and symptoms include: ?Wheezing. ?Trouble breathing (shortness of breath). ?Excessive nighttime or early morning coughing. ?Chest  tightness. ?Tiredness (fatigue) with minimal activity. ?Difficulty talking in complete sentences. ?Poor exercise tolerance. ?How is this diagnosed? ?This condition is diagnosed based on: ?A physical exam and your medical history. ?Tests, which may include: ?Lung function studies to evaluate the flow of air in your lungs. ?Allergy tests. ?Imaging tests, such as X-rays. ?How is this treated? ?There is no cure, but symptoms can be controlled with proper treatment. Treatment usually involves: ?Identifying and avoiding your asthma triggers. ?Inhaled medicines. Two types are commonly used to treat asthma, depending on severity: ?Controller medicines. These help prevent asthma symptoms from occurring. They are taken every day. ?Fast-acting reliever or rescue medicines. These quickly relieve asthma symptoms. They are used as needed and provide short-term relief. ?Using other medicines, such as: ?Allergy medicines, such as antihistamines, if your asthma attacks are triggered by allergens. ?Immune medicines (immunomodulators). These are medicines that help control the immune system. ?Using supplemental oxygen. This is only needed during a severe episode. ?Creating an asthma action plan. An asthma action plan is a written plan for managing and treating your asthma attacks. This plan includes: ?A list of your asthma triggers and how to avoid them. ?Information about when medicines should be taken and when their dosage should be changed. ?Instructions about using a device called a peak flow meter. A peak flow meter measures how well the lungs are working and the severity of your asthma. It helps you monitor your condition. ?Follow these instructions at home: ?Take over-the-counter and prescription medicines only as told by your health care provider. ?Stay up to date on all vaccinations as recommended by your healthcare provider, including vaccines for the flu and pneumonia. ?Use a peak flow meter and keep track of your peak flow  readings. ?Understand and use your asthma   action plan to address any asthma flares. ?Do not smoke or allow anyone to smoke in your home. ?Contact a health care provider if: ?You have wheezing, shortness of breath, or a cough that is not responding to medicines. ?Your medicines are causing side effects, such as a rash, itching, swelling, or trouble breathing. ?You need to use a reliever medicine more than 2-3 times a week. ?Your peak flow reading is still at 50-79% of your personal best after following your action plan for 1 hour. ?You have a fever and shortness of breath. ?Get help right away if: ?You are getting worse and do not respond to treatment during an asthma attack. ?You are short of breath when at rest or when doing very little physical activity. ?You have difficulty eating, drinking, or talking. ?You have chest pain or tightness. ?You develop a fast heartbeat or palpitations. ?You have a bluish color to your lips or fingernails. ?You are light-headed or dizzy, or you faint. ?Your peak flow reading is less than 50% of your personal best. ?You feel too tired to breathe normally. ?These symptoms may be an emergency. Get help right away. Call 911. ?Do not wait to see if the symptoms will go away. ?Do not drive yourself to the hospital. ?Summary ?Asthma is a long-term (chronic) condition that causes recurrent episodes in which the airways become tight and narrow. Asthma episodes, also called asthma attacks or asthma flares, can cause coughing, wheezing, shortness of breath, and chest pain. ?Asthma cannot be cured, but medicines and lifestyle changes can help keep it well controlled and prevent asthma flares. ?Make sure you understand how to avoid triggers and how and when to use your medicines. ?Asthma attacks can range from minor to life-threatening. Get help right away if you have an asthma attack and do not respond to treatment with your usual rescue medicines. ?This information is not intended to replace  advice given to you by your health care provider. Make sure you discuss any questions you have with your health care provider. ?Document Revised: 12/30/2020 Document Reviewed: 12/21/2020 ?Elsevier Patient Education ? 2023 [employer] ? ?

## 2022-04-25 ENCOUNTER — Emergency Department (HOSPITAL_COMMUNITY)
Admission: EM | Admit: 2022-04-25 | Discharge: 2022-04-25 | Discharge status: Against Medical Advice | Payer: BC Managed Care – PPO | Attending: Emergency Medicine | Admitting: Emergency Medicine

## 2022-04-25 ENCOUNTER — Encounter (HOSPITAL_COMMUNITY): Payer: Self-pay

## 2022-04-25 ENCOUNTER — Emergency Department (HOSPITAL_COMMUNITY)
Admission: EM | Admit: 2022-04-25 | Discharge: 2022-04-25 | Disposition: A | Discharge status: Home / Self Care | Payer: BC Managed Care – PPO | Source: Home / Self Care | Attending: Emergency Medicine | Admitting: Emergency Medicine

## 2022-04-25 DIAGNOSIS — Z5321 Procedure and treatment not carried out due to patient leaving prior to being seen by health care provider: Secondary | ICD-10-CM | POA: Insufficient documentation

## 2022-04-25 DIAGNOSIS — T7621XA Adult sexual abuse, suspected, initial encounter: Secondary | ICD-10-CM | POA: Insufficient documentation

## 2022-04-25 DIAGNOSIS — T7421XA Adult sexual abuse, confirmed, initial encounter: Secondary | ICD-10-CM | POA: Diagnosis not present

## 2022-04-25 DIAGNOSIS — K625 Hemorrhage of anus and rectum: Secondary | ICD-10-CM | POA: Diagnosis not present

## 2022-04-25 DIAGNOSIS — Z21 Asymptomatic human immunodeficiency virus [HIV] infection status: Secondary | ICD-10-CM | POA: Diagnosis not present
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
## 2022-04-25 NOTE — ED Notes (Signed)
Called and no answer.

## 2022-04-25 NOTE — ED Notes (Signed)
Pt refusing blood work at this time, reports he does not want to get stuck.

## 2022-04-25 NOTE — ED Notes (Signed)
Pt name called for vitals, no response 
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
## 2022-04-25 NOTE — ED Notes (Signed)
Pt at [place], will dispo from system.

## 2022-04-25 NOTE — ED Notes (Signed)
Pt continues to refuse blood work.
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
## 2022-05-11 ENCOUNTER — Ambulatory Visit: Payer: BC Managed Care – PPO | Admitting: Infectious Diseases
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
## 2022-05-16 ENCOUNTER — Other Ambulatory Visit (HOSPITAL_COMMUNITY): Payer: Self-pay

## 2022-05-16 ENCOUNTER — Other Ambulatory Visit: Payer: Self-pay

## 2022-05-16 ENCOUNTER — Telehealth: Payer: Self-pay

## 2022-05-16 ENCOUNTER — Ambulatory Visit (INDEPENDENT_AMBULATORY_CARE_PROVIDER_SITE_OTHER): Payer: BC Managed Care – PPO | Admitting: Family

## 2022-05-16 ENCOUNTER — Other Ambulatory Visit (HOSPITAL_COMMUNITY)
Admission: RE | Admit: 2022-05-16 | Discharge: 2022-05-16 | Disposition: A | Discharge status: Home / Self Care | Payer: BC Managed Care – PPO | Source: Ambulatory Visit | Attending: Family | Admitting: Family

## 2022-05-16 ENCOUNTER — Encounter: Payer: Self-pay | Admitting: Family

## 2022-05-16 VITALS — BP 119/81 | HR 78 | Temp 97.9°F | Ht 69.0 in | Wt 166.8 lb

## 2022-05-16 DIAGNOSIS — K649 Unspecified hemorrhoids: Secondary | ICD-10-CM | POA: Insufficient documentation

## 2022-05-16 DIAGNOSIS — Z23 Encounter for immunization: Secondary | ICD-10-CM

## 2022-05-16 DIAGNOSIS — Z113 Encounter for screening for infections with a predominantly sexual mode of transmission: Secondary | ICD-10-CM | POA: Diagnosis not present

## 2022-05-16 DIAGNOSIS — K625 Hemorrhage of anus and rectum: Secondary | ICD-10-CM

## 2022-05-16 DIAGNOSIS — B20 Human immunodeficiency virus [HIV] disease: Secondary | ICD-10-CM | POA: Diagnosis not present

## 2022-05-16 DIAGNOSIS — Z0000 Encounter for general adult medical examination without abnormal findings: Secondary | ICD-10-CM

## 2022-05-16 MED ORDER — ALBUTEROL SULFATE HFA 108 (90 BASE) MCG/ACT IN AERS
2.0000 | INHALATION_SPRAY | Freq: Every day | RESPIRATORY_TRACT | 0 refills | Status: DC | PRN
  Filled 2022-05-16: qty 6.7, 90d supply, fill #0

## 2022-05-16 MED ORDER — BIKTARVY 50-200-25 MG PO TABS
1.0000 | ORAL_TABLET | Freq: Every day | ORAL | 5 refills | Status: AC
  Filled 2022-05-16 (×2): qty 30, 30d supply, fill #0
  Filled 2022-06-29 – 2022-10-13 (×5): qty 30, 30d supply, fill #1

## 2022-05-16 NOTE — Assessment & Plan Note (Signed)
Gordon Turner has been taking his medication as prescribed with no adverse side effects and obtaining samples. Reviewed previous lab work and discussed plan of care. Spoke with Pharmacy Team and Gordon Turner is covered with $40 copay with [facility]ay care being [facility] to [name] so should be able to get medications without problems. Discussed long acting Cabenuva and importance of logistics in making appointments as there is risk if he misses appointments. He will evaluate his schedule and ability to logistically transition to [place]. Check lab work today. Continue current dose of Biktarvy. Plan for follow up with Gordon Madeira, NP in 3 months or sooner if needed.
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
## 2022-05-16 NOTE — Assessment & Plan Note (Signed)
Discussed importance of safe sexual practice and condom use. Condoms and STD testing offered.  Menveo and HPV updated  Due for routine dental care.  Introduced anal pap smear for anal cancer screening.

## 2022-05-16 NOTE — Addendum Note (Signed)
Addended by: Leatrice Jewels on: 05/16/2022 03:04 PM   Modules accepted: Orders

## 2022-05-16 NOTE — Telephone Encounter (Signed)
[facility] Patient Advocate Encounter °  °Was successful in obtaining a [employer] copay card for Biktarvy.  This copay card will make the patients copay 0.00. ° °I have spoken with the patient.   ° °The billing information is as follows and has been shared with [facility]. ° ° ° ° ° ° ° °Nyle Limb, CPhT °Specialty Pharmacy Patient Advocate °[facility] °Phone: [phone] °Fax:  [phone]  °

## 2022-05-16 NOTE — Patient Instructions (Addendum)
Nice to see you.  We will check your lab work today.  Continue to take your medication daily as prescribed.  Use sitz baths and gentle wipes as well as OTC suppositories as needed.   Refills have been sent to the pharmacy at [facility].   Plan for follow up with Gordon Turner in 3 months or sooner if needed with lab work on the same day.  Have a great day and stay safe!

## 2022-05-17 LAB — COMPLETE METABOLIC PANEL WITH GFR
AG Ratio: 1.3 (calc) (ref 1.0–2.5)
ALT: 23 U/L (ref 9–46)
AST: 17 U/L (ref 10–40)
Albumin: 4.3 g/dL (ref 3.6–5.1)
Alkaline phosphatase (APISO): 90 U/L (ref 36–130)
BUN: 15 mg/dL (ref 7–25)
CO2: 20 mmol/L (ref 20–32)
Calcium: 9.3 mg/dL (ref 8.6–10.3)
Chloride: 105 mmol/L (ref 98–110)
Creat: 0.76 mg/dL (ref 0.60–1.26)
Globulin: 3.2 g/dL (calc) (ref 1.9–3.7)
Glucose, Bld: 92 mg/dL (ref 65–99)
Potassium: 3.6 mmol/L (ref 3.5–5.3)
Sodium: 138 mmol/L (ref 135–146)
Total Bilirubin: 0.5 mg/dL (ref 0.2–1.2)
Total Protein: 7.5 g/dL (ref 6.1–8.1)
eGFR: 119 mL/min/{1.73_m2} (ref >=60)

## 2022-05-17 LAB — URINE CYTOLOGY ANCILLARY ONLY
Chlamydia: NEGATIVE
Comment: NEGATIVE
Comment: NORMAL
Neisseria Gonorrhea: NEGATIVE

## 2022-05-17 LAB — CYTOLOGY, (ORAL, ANAL, URETHRAL) ANCILLARY ONLY
Chlamydia: NEGATIVE
Chlamydia: NEGATIVE
Comment: NEGATIVE
Comment: NEGATIVE
Comment: NORMAL
Comment: NORMAL
Neisseria Gonorrhea: NEGATIVE
Neisseria Gonorrhea: NEGATIVE

## 2022-05-17 LAB — RPR: RPR Ser Ql: NONREACTIVE

## 2022-05-18 ENCOUNTER — Other Ambulatory Visit: Payer: Self-pay

## 2022-05-18 ENCOUNTER — Other Ambulatory Visit: Payer: BC Managed Care – PPO

## 2022-05-18 DIAGNOSIS — Z113 Encounter for screening for infections with a predominantly sexual mode of transmission: Secondary | ICD-10-CM

## 2022-05-18 DIAGNOSIS — B20 Human immunodeficiency virus [HIV] disease: Secondary | ICD-10-CM | POA: Diagnosis not present

## 2022-05-19 LAB — T-HELPER CELL (CD4) - (RCID CLINIC ONLY)
CD4 % Helper T Cell: 20 % — ABNORMAL LOW (ref 33–65)
CD4 T Cell Abs: 237 /uL — ABNORMAL LOW (ref 400–1790)

## 2022-05-21 LAB — HIV-1 RNA QUANT-NO REFLEX-BLD
HIV 1 RNA Quant: NOT DETECTED Copies/mL
HIV-1 RNA Quant, Log: NOT DETECTED Log cps/mL

## 2022-05-21 LAB — RPR: RPR Ser Ql: NONREACTIVE

## 2022-05-23 ENCOUNTER — Other Ambulatory Visit (HOSPITAL_COMMUNITY): Payer: Self-pay

## 2022-05-27 ENCOUNTER — Telehealth: Payer: BC Managed Care – PPO | Admitting: Nurse Practitioner

## 2022-05-27 DIAGNOSIS — R110 Nausea: Secondary | ICD-10-CM

## 2022-05-27 DIAGNOSIS — J0110 Acute frontal sinusitis, unspecified: Secondary | ICD-10-CM

## 2022-05-27 MED ORDER — AZITHROMYCIN 250 MG PO TABS: ORAL_TABLET | ORAL | 0 refills | Status: AC

## 2022-05-27 MED ORDER — ONDANSETRON 4 MG PO TBDP: 4.0000 mg | ORAL_TABLET | Freq: Three times a day (TID) | ORAL | 1 refills | Status: AC | PRN

## 2022-05-27 MED ORDER — IPRATROPIUM BROMIDE 0.03 % NA SOLN: 2.0000 | Freq: Two times a day (BID) | NASAL | 12 refills | Status: AC

## 2022-05-27 NOTE — Progress Notes (Signed)
Virtual Visit Consent   Gordon Turner, you are scheduled for a virtual visit with a [facility] provider today. Just as with appointments in the office, your consent must be obtained to participate. Your consent will be active for this visit and any virtual visit you may have with one of our providers in the next 365 days. If you have a MyChart account, a copy of this consent can be sent to you electronically.  As this is a virtual visit, video technology does not allow for your provider to perform a traditional examination. This may limit your provider's ability to fully assess your condition. If your provider identifies any concerns that need to be evaluated in person or the need to arrange testing (such as labs, EKG, etc.), we will make arrangements to do so. Although advances in technology are sophisticated, we cannot ensure that it will always work on either your end or our end. If the connection with a video visit is poor, the visit may have to be switched to a telephone visit. With either a video or telephone visit, we are not always able to ensure that we have a secure connection.  By engaging in this virtual visit, you consent to the provision of healthcare and authorize for your insurance to be billed (if applicable) for the services provided during this visit. Depending on your insurance coverage, you may receive a charge related to this service.  I need to obtain your verbal consent now. Are you willing to proceed with your visit today? Gordon Turner has provided verbal consent on 05/27/2022 for a virtual visit (video or telephone). Apolonio Schneiders, FNP  Date: 05/27/2022 10:20 AM  Virtual Visit via Video Note   I, Apolonio Schneiders, connected with  Gordon Turner  ([id number], 22-Apr-1985) on 05/27/22 at 10:15 AM EST by a video-enabled telemedicine application and verified that I am speaking with the correct person using two identifiers.  Location: Patient: Virtual Visit Location Patient:  Home Provider: Virtual Visit Location Provider: Home Office   I discussed the limitations of evaluation and management by telemedicine and the availability of in person appointments. The patient expressed understanding and agreed to proceed.    History of Present Illness: Gordon Turner is a 37 y.o. who identifies as a male who was assigned male at birth, and is being seen today for ongoing sinus congestion.  He has noted darker mucous from his sinuses over the past few days  The PND is causing him to be nauseated   He has chronic allergies and takes Claritin daily  He has been using sudafed for the past few days without relief   He has used Afrin  Denies fevers   Denies body aches  Mild cough from PND only denies any flares in Asthma related to worsening allergies and sinusitis   Had inhaler refilled recently   He states that he typically responds best to Azithromycin  Doxycyline has caused GI upset  Allergy to PCN   Problems:  Patient Active Problem List   Diagnosis Date Noted   Rectal bleeding 05/16/2022   Hypokalemia 12/16/2021   Volume depletion 12/16/2021   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   Healthcare maintenance 09/22/2021   SBO (small bowel obstruction) ([place]) 03/31/2018   Celiac disease 10/20/2016   Moderate persistent asthma with exacerbation 05/12/2016   Depressive disorder 12/26/2013   Allergic rhinitis due to pollen 09/25/2013   Insomnia 09/25/2013   Panic disorder without agoraphobia 09/25/2013   Chronic sinusitis  04/04/2013   Human immunodeficiency virus (HIV) disease ([place]) 09/27/2011    Allergies:  Allergies  Allergen Reactions   Amoxicillin Anaphylaxis   Penicillins Anaphylaxis, Hives, Swelling and Other (See Comments)    DID THE REACTION INVOLVE: Swelling of the face/tongue/throat, SOB, or low BP? No Sudden or severe rash/hives, skin peeling, or the inside of the mouth or nose? No Did it require medical treatment? No When did it last happen?      baby If all above answers are "NO", may proceed with cephalosporin use.   Medications:  Current Outpatient Medications:    albuterol (VENTOLIN HFA) 108 (90 Base) MCG/ACT inhaler, Inhale 2 puffs into the lungs daily as needed for wheezing or shortness of breath., Disp: 6.7 g, Rfl: 0   bictegravir-emtricitabine-tenofovir AF (BIKTARVY) 50-200-25 MG TABS tablet, Take 1 tablet by mouth daily., Disp: 30 tablet, Rfl: 5   Brimonidine Tartrate (LUMIFY OP), Place 1 drop into both eyes daily., Disp: , Rfl:    diphenhydrAMINE (BENADRYL) 25 MG tablet, 1 to 2 tablets every 6 hours for itching and swelling (Patient taking differently: Take 50 mg by mouth at bedtime as needed for sleep.), Disp: 20 tablet, Rfl: 0   ipratropium (ATROVENT) 0.02 % nebulizer solution, Take 0.5 mg by nebulization as needed for shortness of breath or wheezing., Disp: , Rfl:    Ipratropium-Albuterol (COMBIVENT RESPIMAT) 20-100 MCG/ACT AERS respimat, Inhale 1 puff into the lungs every 6 (six) hours. (Patient taking differently: Inhale 1 puff into the lungs as needed for shortness of breath.), Disp: 4 g, Rfl: 0   ondansetron (ZOFRAN-ODT) 4 MG disintegrating tablet, Take 1 tablet (4 mg total) by mouth every 8 (eight) hours as needed for nausea or vomiting., Disp: 30 tablet, Rfl: 1   penciclovir (DENAVIR) 1 % cream, Apply 1 Application topically every 2 (two) hours. (Patient not taking: Reported on 05/16/2022), Disp: 5 g, Rfl: 0   predniSONE (DELTASONE) 20 MG tablet, Take 2 tablets (40 mg total) by mouth daily. (Patient not taking: Reported on 05/16/2022), Disp: 10 tablet, Rfl: 0   traMADol (ULTRAM) 50 MG tablet, Take 1 tablet (50 mg total) by mouth every 6 (six) hours as needed for moderate pain. (Patient not taking: Reported on 05/16/2022), Disp: 15 tablet, Rfl: 0   triamcinolone ointment (KENALOG) 0.5 %, Apply 1 Application topically 2 (two) times daily. (Patient not taking: Reported on 12/16/2021), Disp: 30 g, Rfl: 0   valACYclovir (VALTREX)  1000 MG tablet, 2 tablets now and repeat in 12 hours., Disp: 8 tablet, Rfl: 0  Observations/Objective: Patient is well-developed, well-nourished in no acute distress.  Resting comfortably  at home.  Head is normocephalic, atraumatic.  No labored breathing.  Speech is clear and coherent with logical content.  Patient is alert and oriented at baseline.    Assessment and Plan: 1. Acute non-recurrent frontal sinusitis  - ipratropium (ATROVENT) 0.03 % nasal spray; Place 2 sprays into both nostrils every 12 (twelve) hours.  Dispense: 30 mL; Refill: 12 - azithromycin (ZITHROMAX) 250 MG tablet; Take 2 tablets on day 1, then 1 tablet daily on days 2 through 5  Dispense: 6 tablet; Refill: 0     Follow Up Instructions: I discussed the assessment and treatment plan with the patient. The patient was provided an opportunity to ask questions and all were answered. The patient agreed with the plan and demonstrated an understanding of the instructions.  A copy of instructions were sent to the patient via MyChart unless otherwise noted below.  The patient was advised to call back or seek an in-person evaluation if the symptoms worsen or if the condition fails to improve as anticipated.  Time:  I spent 15 minutes with the patient via telehealth technology discussing the above problems/concerns.    Apolonio Schneiders, FNP

## 2022-06-01 ENCOUNTER — Other Ambulatory Visit (HOSPITAL_COMMUNITY): Payer: Self-pay

## 2022-06-17 ENCOUNTER — Encounter: Payer: Self-pay | Admitting: Family

## 2022-06-17 ENCOUNTER — Other Ambulatory Visit: Payer: Self-pay

## 2022-06-17 ENCOUNTER — Telehealth (INDEPENDENT_AMBULATORY_CARE_PROVIDER_SITE_OTHER): Payer: BC Managed Care – PPO | Admitting: Family

## 2022-06-17 DIAGNOSIS — B20 Human immunodeficiency virus [HIV] disease: Secondary | ICD-10-CM | POA: Diagnosis not present

## 2022-06-17 DIAGNOSIS — K649 Unspecified hemorrhoids: Secondary | ICD-10-CM

## 2022-06-17 MED ORDER — HYDROCORTISONE ACETATE 25 MG RE SUPP: 25.0000 mg | Freq: Two times a day (BID) | RECTAL | 1 refills | Status: AC

## 2022-06-17 NOTE — Patient Instructions (Signed)
Nice to see you.  Start suppositories and continue with sitz baths and moist wipes following bowel movements.   Recommend contacting an attorney to help answer your legal questions.   Plan for follow up as scheduled.  Have a great day and stay safe!

## 2022-06-17 NOTE — Assessment & Plan Note (Signed)
Tsion has a flare of his hemorrhoid. Discussed importance of good bowel and anal hygiene. Will try Anusol suppositories to help with symptoms and if no improvement can refer to GI or General Surgery for evaluation for possible surgical intervention.

## 2022-06-17 NOTE — Progress Notes (Signed)
Brief Narrative   Patient ID: Gordon Turner, male    DOB: 10-18-1985, 37 y.o.   MRN: [id number]  Gordon Turner is a 37 y/o male with HIV disease diagnosed in 2012 with risk factor of MSM. Initial viral load and CD4 count unknown. Genosures with wild type virus and no significant medication resistant mutations. No history of opportunistic infection. [id number] negative. ART experienced with Reyataz/norvir + Truvada, Stribild, and Biktarvy.   Subjective:    Chief Complaint  Patient presents with   Hemorrhoids     Virtual Visit via Telephone/Video Note   I connected with Gordon Turner on 06/17/2022 at 10am via video and verified that I am speaking with the correct person using two identifiers.   I discussed the limitations, risks, security and privacy concerns of performing an evaluation and management service by telephone and the availability of in person appointments. I also discussed with the patient that there may be a patient responsible charge related to this service. The patient expressed understanding and agreed to proceed.  Location:  Patient: Home in [place], [place] Provider: [facility] in [place]  HPI:  Gordon Turner is a 37 y.o. male with HIV disease presents for telehealth visit with an acute concern.   Chevon continues to have an issue with hemorrhoids and bleeding with bowel movements that has improved slightly since his last office visit and is interested in a referral to Gastroenterology for evaluation and potential hemorrhoidectomy.  Also has a couple of legal related questions to some recent events.   Denies fevers, chills, night sweats, headaches, changes in vision, neck pain/stiffness, nausea, diarrhea, vomiting, lesions or rashes.    Allergies  Allergen Reactions   Amoxicillin Anaphylaxis   Penicillins Anaphylaxis, Hives, Swelling and Other (See Comments)    DID THE REACTION INVOLVE: Swelling of the face/tongue/throat, SOB, or low BP? No Sudden or severe  rash/hives, skin peeling, or the inside of the mouth or nose? No Did it require medical treatment? No When did it last happen?     baby If all above answers are "NO", may proceed with cephalosporin use.      Outpatient Medications Prior to Visit  Medication Sig Dispense Refill   albuterol (VENTOLIN HFA) 108 (90 Base) MCG/ACT inhaler Inhale 2 puffs into the lungs daily as needed for wheezing or shortness of breath. 6.7 g 0   bictegravir-emtricitabine-tenofovir AF (BIKTARVY) 50-200-25 MG TABS tablet Take 1 tablet by mouth daily. 30 tablet 5   Brimonidine Tartrate (LUMIFY OP) Place 1 drop into both eyes daily.     diphenhydrAMINE (BENADRYL) 25 MG tablet 1 to 2 tablets every 6 hours for itching and swelling (Patient taking differently: Take 50 mg by mouth at bedtime as needed for sleep.) 20 tablet 0   ipratropium (ATROVENT) 0.02 % nebulizer solution Take 0.5 mg by nebulization as needed for shortness of breath or wheezing.     ipratropium (ATROVENT) 0.03 % nasal spray Place 2 sprays into both nostrils every 12 (twelve) hours. 30 mL 12   Ipratropium-Albuterol (COMBIVENT RESPIMAT) 20-100 MCG/ACT AERS respimat Inhale 1 puff into the lungs every 6 (six) hours. (Patient taking differently: Inhale 1 puff into the lungs as needed for shortness of breath.) 4 g 0   ondansetron (ZOFRAN-ODT) 4 MG disintegrating tablet Take 1 tablet (4 mg total) by mouth every 8 (eight) hours as needed for nausea or vomiting. 30 tablet 1   penciclovir (DENAVIR) 1 % cream Apply 1 Application topically every 2 (two) hours. (  Patient not taking: Reported on 05/16/2022) 5 g 0   predniSONE (DELTASONE) 20 MG tablet Take 2 tablets (40 mg total) by mouth daily. (Patient not taking: Reported on 05/16/2022) 10 tablet 0   traMADol (ULTRAM) 50 MG tablet Take 1 tablet (50 mg total) by mouth every 6 (six) hours as needed for moderate pain. (Patient not taking: Reported on 05/16/2022) 15 tablet 0   triamcinolone ointment (KENALOG) 0.5 % Apply 1  Application topically 2 (two) times daily. (Patient not taking: Reported on 12/16/2021) 30 g 0   valACYclovir (VALTREX) 1000 MG tablet 2 tablets now and repeat in 12 hours. 8 tablet 0   No facility-administered medications prior to visit.     Past Medical History:  Diagnosis Date   Asthma    HIV (human immunodeficiency virus infection) ([place])      No past surgical history on file.    Review of Systems  Constitutional:  Negative for chills, diaphoresis, fatigue and fever.  Respiratory:  Negative for cough, chest tightness, shortness of breath and wheezing.   Cardiovascular:  Negative for chest pain.  Gastrointestinal:  Negative for abdominal pain, diarrhea, nausea and vomiting.       Positive for hemorrhoids  Psychiatric/Behavioral:  The patient is nervous/anxious.       Objective:    There were no vitals taken for this visit. Nursing note and vital signs reviewed.  Physical Exam Limited secondary to video visit. Appears in no apparent distress and pleasant to speak with.      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 Discussed importance of good bowel and anal hygiene. Will try Anusol suppositories to help with symptoms and if no improvement can refer to GI or General Surgery for evaluation for possible surgical intervention.         Other   Human immunodeficiency virus (HIV) disease ([place])    Gordon Turner continues to have well controlled virus with good adherence and tolerance to [employer]. Reviewed lab work. Has legal questions related to previous events that occurred and have recommended that he speak with an attorney as I am not able to provide legal advice. Continue current dose of Biktarvy. Plan for follow up as planned.         I am having Gordon Turner. Gordon Turner start on hydrocortisone. I am also having him maintain his diphenhydrAMINE, Combivent Respimat, ipratropium, triamcinolone ointment, Brimonidine Tartrate (LUMIFY OP), traMADol, predniSONE, penciclovir, valACYclovir, Biktarvy, albuterol, ondansetron, and ipratropium.   Meds ordered  this encounter  Medications   hydrocortisone (ANUSOL-HC) 25 MG suppository    Sig: Place 1 suppository (25 mg total) rectally 2 (two) times daily.    Dispense:  12 suppository    Refill:  1    Order Specific Question:   Supervising Provider    Answer:   Carlyle Basques [phone]     I discussed the assessment and treatment plan with the patient. The patient was provided an opportunity to ask questions and all were answered. The patient agreed with the plan and demonstrated an understanding of the instructions.   The patient was advised to call back or seek an in-person evaluation if the symptoms worsen or if the condition fails to improve as anticipated.   I provided  15  minutes of non-face-to-face time during this encounter.  Follow-up: As planned.    Terri Piedra, MSN, FNP-C Nurse Practitioner [facility] [place] number: [phone]

## 2022-06-17 NOTE — Assessment & Plan Note (Signed)
Deryn continues to have well controlled virus with good adherence and tolerance to [employer]. Reviewed lab work. Has legal questions related to previous events that occurred and have recommended that he speak with an attorney as I am not able to provide legal advice. Continue current dose of Biktarvy. Plan for follow up as planned.

## 2022-06-22 ENCOUNTER — Other Ambulatory Visit (HOSPITAL_COMMUNITY): Payer: Self-pay

## 2022-06-24 ENCOUNTER — Other Ambulatory Visit (HOSPITAL_COMMUNITY): Payer: Self-pay

## 2022-06-24 ENCOUNTER — Telehealth: Payer: BC Managed Care – PPO | Admitting: Physician Assistant

## 2022-06-24 DIAGNOSIS — F5102 Adjustment insomnia: Secondary | ICD-10-CM | POA: Diagnosis not present

## 2022-06-24 DIAGNOSIS — F419 Anxiety disorder, unspecified: Secondary | ICD-10-CM

## 2022-06-24 MED ORDER — TRAZODONE HCL 50 MG PO TABS: 50.0000 mg | ORAL_TABLET | Freq: Every day | ORAL | 0 refills | Status: AC

## 2022-06-24 MED ORDER — HYDROXYZINE PAMOATE 25 MG PO CAPS: 25.0000 mg | ORAL_CAPSULE | Freq: Three times a day (TID) | ORAL | 0 refills | Status: AC | PRN

## 2022-06-24 NOTE — Patient Instructions (Addendum)
Gordon Turner, thank you for joining Mar Daring, PA-C for today's virtual visit.  While this provider is not your primary care provider (PCP), if your PCP is located in our provider database this encounter information will be shared with them immediately following your visit.   [facility] account gives you access to today's visit and all your visits, tests, and labs performed at [facility] " click here if you don't have a [facility] account or go to [email or web address]  Consent: (Patient) Gordon Turner provided verbal consent for this virtual visit at the beginning of the encounter.  Current Medications:  Current Outpatient Medications:    hydrOXYzine (VISTARIL) 25 MG capsule, Take 1 capsule (25 mg total) by mouth every 8 (eight) hours as needed., Disp: 30 capsule, Rfl: 0   traZODone (DESYREL) 50 MG tablet, Take 1-2 tablets (50-100 mg total) by mouth at bedtime., Disp: 30 tablet, Rfl: 0   albuterol (VENTOLIN HFA) 108 (90 Base) MCG/ACT inhaler, Inhale 2 puffs into the lungs daily as needed for wheezing or shortness of breath., Disp: 6.7 g, Rfl: 0   bictegravir-emtricitabine-tenofovir AF (BIKTARVY) 50-200-25 MG TABS tablet, Take 1 tablet by mouth daily., Disp: 30 tablet, Rfl: 5   Brimonidine Tartrate (LUMIFY OP), Place 1 drop into both eyes daily., Disp: , Rfl:    diphenhydrAMINE (BENADRYL) 25 MG tablet, 1 to 2 tablets every 6 hours for itching and swelling (Patient taking differently: Take 50 mg by mouth at bedtime as needed for sleep.), Disp: 20 tablet, Rfl: 0   hydrocortisone (ANUSOL-HC) 25 MG suppository, Place 1 suppository (25 mg total) rectally 2 (two) times daily., Disp: 12 suppository, Rfl: 1   ipratropium (ATROVENT) 0.02 % nebulizer solution, Take 0.5 mg by nebulization as needed for shortness of breath or wheezing., Disp: , Rfl:    ipratropium (ATROVENT) 0.03 % nasal spray, Place 2 sprays into both nostrils every 12 (twelve) hours., Disp:  30 mL, Rfl: 12   Ipratropium-Albuterol (COMBIVENT RESPIMAT) 20-100 MCG/ACT AERS respimat, Inhale 1 puff into the lungs every 6 (six) hours. (Patient taking differently: Inhale 1 puff into the lungs as needed for shortness of breath.), Disp: 4 g, Rfl: 0   ondansetron (ZOFRAN-ODT) 4 MG disintegrating tablet, Take 1 tablet (4 mg total) by mouth every 8 (eight) hours as needed for nausea or vomiting., Disp: 30 tablet, Rfl: 1   penciclovir (DENAVIR) 1 % cream, Apply 1 Application topically every 2 (two) hours. (Patient not taking: Reported on 05/16/2022), Disp: 5 g, Rfl: 0   predniSONE (DELTASONE) 20 MG tablet, Take 2 tablets (40 mg total) by mouth daily. (Patient not taking: Reported on 05/16/2022), Disp: 10 tablet, Rfl: 0   traMADol (ULTRAM) 50 MG tablet, Take 1 tablet (50 mg total) by mouth every 6 (six) hours as needed for moderate pain. (Patient not taking: Reported on 05/16/2022), Disp: 15 tablet, Rfl: 0   triamcinolone ointment (KENALOG) 0.5 %, Apply 1 Application topically 2 (two) times daily. (Patient not taking: Reported on 12/16/2021), Disp: 30 g, Rfl: 0   valACYclovir (VALTREX) 1000 MG tablet, 2 tablets now and repeat in 12 hours., Disp: 8 tablet, Rfl: 0   Medications ordered in this encounter:  Meds ordered this encounter  Medications   traZODone (DESYREL) 50 MG tablet    Sig: Take 1-2 tablets (50-100 mg total) by mouth at bedtime.    Dispense:  30 tablet    Refill:  0    Order Specific Question:   Supervising Provider  Answer:   Chase Picket [id number]   hydrOXYzine (VISTARIL) 25 MG capsule    Sig: Take 1 capsule (25 mg total) by mouth every 8 (eight) hours as needed.    Dispense:  30 capsule    Refill:  0    Order Specific Question:   Supervising Provider    Answer:   Chase Picket [id number]     *If you need refills on other medications prior to your next appointment, please contact your pharmacy*  Follow-Up: Call back or seek an in-person evaluation if the symptoms  worsen or if the condition fails to improve as anticipated.  [name] [phone]  Other Instructions  [facility] Phone: [phone] Address: [address], [place] [address] Hours: Open 24/7, No appointment required.   Managing Anxiety, Adult After being diagnosed with anxiety, you may be relieved to know why you have felt or behaved a certain way. You may also feel overwhelmed about the treatment ahead and what it will mean for your life. With care and support, you can manage your anxiety. How to manage lifestyle changes Understanding the difference between stress and anxiety Although stress can play a role in anxiety, it is not the same as anxiety. Stress is your body's reaction to life changes and events, both good and bad. Stress is often caused by something external, such as a deadline, test, or competition. It normally goes away after the event has ended and will last just a few hours. But, stress can be ongoing and can lead to more than just stress. Anxiety is caused by something internal, such as imagining a terrible outcome or worrying that something will go wrong that will greatly upset you. Anxiety often does not go away even after the event is over, and it can become a long-term (chronic) worry. Lowering stress and anxiety Talk with your health care provider or a counselor to learn more about lowering anxiety and stress. They may suggest tension-reduction techniques, such as: Music. Spend time creating or listening to music that you enjoy and that inspires you. Mindfulness-based meditation. Practice being aware of your normal breaths while not trying to control your breathing. It can be done while sitting or walking. Centering prayer. Focus on a word, phrase, or sacred image that means something to you and brings you peace. Deep breathing. Expand your stomach and inhale slowly through your nose. Hold your breath for 3-5  seconds. Then breathe out slowly, letting your stomach muscles relax. Self-talk. Learn to notice and spot thought patterns that lead to anxiety reactions. Change those patterns to thoughts that feel peaceful. Muscle relaxation. Take time to tense muscles and then relax them. Choose a tension-reduction technique that fits your lifestyle and personality. These techniques take time and practice. Set aside 5-15 minutes a day to do them. Specialized therapists can offer counseling and training in these techniques. The training to help with anxiety may be covered by some insurance plans. Other things you can do to manage stress and anxiety include: Keeping a stress diary. This can help you learn what triggers your reaction and then learn ways to manage your response. Thinking about how you react to certain situations. You may not be able to control everything, but you can control your response. Making time for activities that help you relax and not feeling guilty about spending your time in this way. Doing visual imagery. This involves imagining or creating mental pictures to help you relax.  Practicing yoga. Through yoga poses, you can lower tension and relax.  Medicines Medicines for anxiety include: Antidepressant medicines. These are usually prescribed for long-term daily control. Anti-anxiety medicines. These may be added in severe cases, especially when panic attacks occur. When used together, medicines, psychotherapy, and tension-reduction techniques may be the most effective treatment. Relationships Relationships can play a big part in helping you recover. Spend more time connecting with trusted friends and family members. Think about going to couples counseling if you have a partner, taking family education classes, or going to family therapy. Therapy can help you and others better understand your anxiety. How to recognize changes in your anxiety Everyone responds differently to treatment for  anxiety. Recovery from anxiety happens when symptoms lessen and stop interfering with your daily life at home or work. This may mean that you will start to: Have better concentration and focus. Worry will interfere less in your daily thinking. Sleep better. Be less irritable. Have more energy. Have improved memory. Try to recognize when your condition is getting worse. Contact your provider if your symptoms interfere with home or work and you feel like your condition is not improving. Follow these instructions at home: Activity Exercise. Adults should: Exercise for at least 150 minutes each week. The exercise should increase your heart rate and make you sweat (moderate-intensity exercise). Do strengthening exercises at least twice a week. Get the right amount and quality of sleep. Most adults need 7-9 hours of sleep each night. Lifestyle  Eat a healthy diet that includes plenty of vegetables, fruits, whole grains, low-fat dairy products, and lean protein. Do not eat a lot of foods that are high in fats, added sugars, or salt (sodium). Make choices that simplify your life. Do not use any products that contain nicotine or tobacco. These products include cigarettes, chewing tobacco, and vaping devices, such as e-cigarettes. If you need help quitting, ask your provider. Avoid caffeine, alcohol, and certain over-the-counter cold medicines. These may make you feel worse. Ask your pharmacist which medicines to avoid. General instructions Take over-the-counter and prescription medicines only as told by your provider. Keep all follow-up visits. This is to make sure you are managing your anxiety well or if you need more support. Where to find support You can get help and support from: Self-help groups. Online and [employer]. A trusted spiritual leader. Couples counseling. Family education classes. Family therapy. Where to find more information You may find that joining a support  group helps you deal with your anxiety. The following sources can help you find counselors or support groups near you: [place]: mentalhealthamerica.net Anxiety and Depression Association of [place] (ADAA): adaa.[place] on Mental Illness (NAMI): nami.org Contact a health care provider if: You have a hard time staying focused or finishing tasks. You spend many hours a day feeling worried about everyday life. You are very tired because you cannot stop worrying. You start to have headaches or often feel tense. You have chronic nausea or diarrhea. Get help right away if: Your heart feels like it is racing. You have shortness of breath. You have thoughts of hurting yourself or others. Get help right away if you feel like you may hurt yourself or others, or have thoughts about taking your own life. Go to your nearest emergency room or: Call 911. Call the [facility] at (726) 349-2472 or 988. This is open 24 hours a day. Text the Crisis Text Line at (310)134-4314. This information is not intended to  replace advice given to you by your health care provider. Make sure you discuss any questions you have with your health care provider. Document Revised: 12/21/2021 Document Reviewed: 07/05/2020 Elsevier Patient Education  [place].    If you have been instructed to have an in-person evaluation today at a local Urgent Care facility, please use the link below. It will take you to a list of all of our available [facility], including address, phone number and hours of operation. Please do not delay care.  [facility]  If you or a family member do not have a primary care provider, use the link below to schedule a visit and establish care. When you choose a [facility] primary care physician or advanced practice provider, you gain a long-term partner in health. Find a Primary Care Provider  Learn more about [facility]'s  in-office and virtual care options: [facility]

## 2022-06-24 NOTE — Progress Notes (Signed)
Virtual Visit Consent   Gordon Turner, you are scheduled for a virtual visit with a [facility] provider today. Just as with appointments in the office, your consent must be obtained to participate. Your consent will be active for this visit and any virtual visit you may have with one of our providers in the next 365 days. If you have a MyChart account, a copy of this consent can be sent to you electronically.  As this is a virtual visit, video technology does not allow for your provider to perform a traditional examination. This may limit your provider's ability to fully assess your condition. If your provider identifies any concerns that need to be evaluated in person or the need to arrange testing (such as labs, EKG, etc.), we will make arrangements to do so. Although advances in technology are sophisticated, we cannot ensure that it will always work on either your end or our end. If the connection with a video visit is poor, the visit may have to be switched to a telephone visit. With either a video or telephone visit, we are not always able to ensure that we have a secure connection.  By engaging in this virtual visit, you consent to the provision of healthcare and authorize for your insurance to be billed (if applicable) for the services provided during this visit. Depending on your insurance coverage, you may receive a charge related to this service.  I need to obtain your verbal consent now. Are you willing to proceed with your visit today? Gordon Turner has provided verbal consent on 06/24/2022 for a virtual visit (video or telephone). Gordon Daring, PA-C  Date: 06/24/2022 7:48 PM  Virtual Visit via Video Note   I, Gordon Turner, connected with  Gordon Turner  [name], 25-Jul-1985) on 06/24/22 at  7:30 PM EDT by a video-enabled telemedicine application and verified that I am speaking with the correct person using two identifiers.  Location: Patient: Virtual Visit Location  Patient: Home Provider: Virtual Visit Location Provider: Home Office   I discussed the limitations of evaluation and management by telemedicine and the availability of in person appointments. The patient expressed understanding and agreed to proceed.    History of Present Illness: Gordon Turner is a 37 y.o. who identifies as a male who was assigned male at birth, and is being seen today for last 2-3 days has had increased anxiety and insomnia.Having decreased appetite. Has had some issues that have been triggering for his anxiety. He reports he has had issues for a while and has been worse before with requiring Lexapro and Xanax in the past. He has been off of this and does not desire long term treatment at this time. He has been seeing his neighbor during the day to help occupy time to lessen anxieties. Does report he did go to [facility] today and had a panic attack and felt like he was dying, but worked through. He reports he does not want to go to the hospital as he feels that will worsen his anxieties being around sick people. He has tried Benadryl to sleep without success. Sleep has been broken up into 2-4 hour increments. Awaking not well rested. He is able to contract for safety.    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   Healthcare maintenance 09/22/2021   SBO (small  bowel obstruction) ([place]) 03/31/2018   Celiac disease 10/20/2016   Moderate persistent asthma with exacerbation 05/12/2016   Depressive disorder 12/26/2013   Allergic rhinitis due to pollen 09/25/2013   Insomnia 09/25/2013   Panic disorder without agoraphobia 09/25/2013   Chronic sinusitis 04/04/2013   Human immunodeficiency virus (HIV) disease ([place]) 09/27/2011    Allergies:  Allergies  Allergen Reactions   Amoxicillin Anaphylaxis   Penicillins Anaphylaxis, Hives, Swelling and Other  (See Comments)    DID THE REACTION INVOLVE: Swelling of the face/tongue/throat, SOB, or low BP? No Sudden or severe rash/hives, skin peeling, or the inside of the mouth or nose? No Did it require medical treatment? No When did it last happen?     baby If all above answers are "NO", may proceed with cephalosporin use.   Medications:  Current Outpatient Medications:    hydrOXYzine (VISTARIL) 25 MG capsule, Take 1 capsule (25 mg total) by mouth every 8 (eight) hours as needed., Disp: 30 capsule, Rfl: 0   traZODone (DESYREL) 50 MG tablet, Take 1-2 tablets (50-100 mg total) by mouth at bedtime., Disp: 30 tablet, Rfl: 0   albuterol (VENTOLIN HFA) 108 (90 Base) MCG/ACT inhaler, Inhale 2 puffs into the lungs daily as needed for wheezing or shortness of breath., Disp: 6.7 g, Rfl: 0   bictegravir-emtricitabine-tenofovir AF (BIKTARVY) 50-200-25 MG TABS tablet, Take 1 tablet by mouth daily., Disp: 30 tablet, Rfl: 5   Brimonidine Tartrate (LUMIFY OP), Place 1 drop into both eyes daily., Disp: , Rfl:    diphenhydrAMINE (BENADRYL) 25 MG tablet, 1 to 2 tablets every 6 hours for itching and swelling (Patient taking differently: Take 50 mg by mouth at bedtime as needed for sleep.), Disp: 20 tablet, Rfl: 0   hydrocortisone (ANUSOL-HC) 25 MG suppository, Place 1 suppository (25 mg total) rectally 2 (two) times daily., Disp: 12 suppository, Rfl: 1   ipratropium (ATROVENT) 0.02 % nebulizer solution, Take 0.5 mg by nebulization as needed for shortness of breath or wheezing., Disp: , Rfl:    ipratropium (ATROVENT) 0.03 % nasal spray, Place 2 sprays into both nostrils every 12 (twelve) hours., Disp: 30 mL, Rfl: 12   Ipratropium-Albuterol (COMBIVENT RESPIMAT) 20-100 MCG/ACT AERS respimat, Inhale 1 puff into the lungs every 6 (six) hours. (Patient taking differently: Inhale 1 puff into the lungs as needed for shortness of breath.), Disp: 4 g, Rfl: 0   ondansetron (ZOFRAN-ODT) 4 MG disintegrating tablet, Take 1 tablet (4 mg  total) by mouth every 8 (eight) hours as needed for nausea or vomiting., Disp: 30 tablet, Rfl: 1   penciclovir (DENAVIR) 1 % cream, Apply 1 Application topically every 2 (two) hours. (Patient not taking: Reported on 05/16/2022), Disp: 5 g, Rfl: 0   predniSONE (DELTASONE) 20 MG tablet, Take 2 tablets (40 mg total) by mouth daily. (Patient not taking: Reported on 05/16/2022), Disp: 10 tablet, Rfl: 0   traMADol (ULTRAM) 50 MG tablet, Take 1 tablet (50 mg total) by mouth every 6 (six) hours as needed for moderate pain. (Patient not taking: Reported on 05/16/2022), Disp: 15 tablet, Rfl: 0   triamcinolone ointment (KENALOG) 0.5 %, Apply 1 Application topically 2 (two) times daily. (Patient not taking: Reported on 12/16/2021), Disp: 30 g, Rfl: 0   valACYclovir (VALTREX) 1000 MG tablet, 2 tablets now and repeat in 12 hours., Disp: 8 tablet, Rfl: 0  Observations/Objective: Patient is well-developed, well-nourished in no acute distress.  Resting comfortably at home.  Head is normocephalic, atraumatic.  No labored breathing.  Speech is  clear and coherent with logical content.  Patient is alert and oriented at baseline.    Assessment and Plan: 1. Acute anxiety - hydrOXYzine (VISTARIL) 25 MG capsule; Take 1 capsule (25 mg total) by mouth every 8 (eight) hours as needed.  Dispense: 30 capsule; Refill: 0  2. Adjustment insomnia - traZODone (DESYREL) 50 MG tablet; Take 1-2 tablets (50-100 mg total) by mouth at bedtime.  Dispense: 30 tablet; Refill: 0  - Add trazodone for sleep - Hydroxyzine for PRN acute anxiety attacks - Follow up in person urgently if worsening; discussed behavioral health UC (information provided in AVS if needed) - Follow up with PCP for medication continuation if needed  Follow Up Instructions: I discussed the assessment and treatment plan with the patient. The patient was provided an opportunity to ask questions and all were answered. The patient agreed with the plan and demonstrated  an understanding of the instructions.  A copy of instructions were sent to the patient via MyChart unless otherwise noted below.    The patient was advised to call back or seek an in-person evaluation if the symptoms worsen or if the condition fails to improve as anticipated.  Time:  I spent 15 minutes with the patient via telehealth technology discussing the above problems/concerns.    Gordon Daring, PA-C

## 2022-06-27 ENCOUNTER — Other Ambulatory Visit (HOSPITAL_COMMUNITY): Payer: Self-pay

## 2022-06-29 ENCOUNTER — Other Ambulatory Visit: Payer: Self-pay

## 2022-08-02 ENCOUNTER — Ambulatory Visit: Payer: BC Managed Care – PPO | Admitting: Infectious Diseases

## 2022-10-12 ENCOUNTER — Other Ambulatory Visit (HOSPITAL_COMMUNITY): Payer: Self-pay

## 2022-10-12 ENCOUNTER — Other Ambulatory Visit: Payer: Self-pay

## 2022-10-13 ENCOUNTER — Other Ambulatory Visit (HOSPITAL_COMMUNITY): Payer: Self-pay

## 2022-10-13 ENCOUNTER — Other Ambulatory Visit: Payer: Self-pay

## 2022-10-18 ENCOUNTER — Other Ambulatory Visit (HOSPITAL_COMMUNITY): Payer: Self-pay

## 2022-11-02 ENCOUNTER — Other Ambulatory Visit (HOSPITAL_COMMUNITY): Payer: Self-pay

## 2022-11-04 ENCOUNTER — Other Ambulatory Visit (HOSPITAL_COMMUNITY): Payer: Self-pay

## 2022-11-07 ENCOUNTER — Encounter (HOSPITAL_COMMUNITY): Payer: Self-pay

## 2022-11-07 ENCOUNTER — Other Ambulatory Visit (HOSPITAL_COMMUNITY): Payer: Self-pay

## 2022-11-08 ENCOUNTER — Other Ambulatory Visit: Payer: Self-pay

## 2022-11-17 ENCOUNTER — Other Ambulatory Visit (HOSPITAL_COMMUNITY): Payer: Self-pay

## 2022-11-17 ENCOUNTER — Encounter: Payer: Self-pay | Admitting: Pharmacy Technician

## 2022-12-23 ENCOUNTER — Other Ambulatory Visit: Payer: Self-pay

## 2023-09-13 ENCOUNTER — Telehealth: Admitting: Physician Assistant

## 2023-09-13 DIAGNOSIS — J4520 Mild intermittent asthma, uncomplicated: Secondary | ICD-10-CM | POA: Diagnosis not present

## 2023-09-13 MED ORDER — ALBUTEROL SULFATE HFA 108 (90 BASE) MCG/ACT IN AERS: 2.0000 | INHALATION_SPRAY | Freq: Every day | RESPIRATORY_TRACT | 0 refills | Status: AC | PRN

## 2023-09-13 NOTE — Patient Instructions (Signed)
 Gordon Turner, thank you for joining Angelia Kelp, PA-C for today's virtual visit.  While this provider is not your primary care provider (PCP), if your PCP is located in our provider database this encounter information will be shared with them immediately following your visit.   A [place] MyChart account gives you access to today's visit and all your visits, tests, and labs performed at [facility]  click here if you don't have a [facility] MyChart account or go to [email or web address]  Consent: (Patient) Gordon Turner provided verbal consent for this virtual visit at the beginning of the encounter.  Current Medications:  Current Outpatient Medications:    albuterol  (VENTOLIN  HFA) 108 (90 Base) MCG/ACT inhaler, Inhale 2 puffs into the lungs daily as needed for wheezing or shortness of breath., Disp: 8 g, Rfl: 0   bictegravir-emtricitabine -tenofovir  AF (BIKTARVY ) 50-200-25 MG TABS tablet, Take 1 tablet by mouth daily., Disp: 30 tablet, Rfl: 5   Brimonidine  Tartrate (LUMIFY  OP), Place 1 drop into both eyes daily., Disp: , Rfl:    diphenhydrAMINE  (BENADRYL ) 25 MG tablet, 1 to 2 tablets every 6 hours for itching and swelling (Patient taking differently: Take 50 mg by mouth at bedtime as needed for sleep.), Disp: 20 tablet, Rfl: 0   hydrocortisone  (ANUSOL -HC) 25 MG suppository, Place 1 suppository (25 mg total) rectally 2 (two) times daily., Disp: 12 suppository, Rfl: 1   hydrOXYzine  (VISTARIL ) 25 MG capsule, Take 1 capsule (25 mg total) by mouth every 8 (eight) hours as needed., Disp: 30 capsule, Rfl: 0   ipratropium (ATROVENT ) 0.02 % nebulizer solution, Take 0.5 mg by nebulization as needed for shortness of breath or wheezing., Disp: , Rfl:    ipratropium (ATROVENT ) 0.03 % nasal spray, Place 2 sprays into both nostrils every 12 (twelve) hours., Disp: 30 mL, Rfl: 12   Ipratropium-Albuterol  (COMBIVENT  RESPIMAT) 20-100 MCG/ACT AERS respimat, Inhale 1 puff into the lungs  every 6 (six) hours. (Patient taking differently: Inhale 1 puff into the lungs as needed for shortness of breath.), Disp: 4 g, Rfl: 0   ondansetron  (ZOFRAN -ODT) 4 MG disintegrating tablet, Take 1 tablet (4 mg total) by mouth every 8 (eight) hours as needed for nausea or vomiting., Disp: 30 tablet, Rfl: 1   penciclovir  (DENAVIR ) 1 % cream, Apply 1 Application topically every 2 (two) hours. (Patient not taking: Reported on 05/16/2022), Disp: 5 g, Rfl: 0   predniSONE  (DELTASONE ) 20 MG tablet, Take 2 tablets (40 mg total) by mouth daily. (Patient not taking: Reported on 05/16/2022), Disp: 10 tablet, Rfl: 0   traMADol  (ULTRAM ) 50 MG tablet, Take 1 tablet (50 mg total) by mouth every 6 (six) hours as needed for moderate pain. (Patient not taking: Reported on 05/16/2022), Disp: 15 tablet, Rfl: 0   traZODone  (DESYREL ) 50 MG tablet, Take 1-2 tablets (50-100 mg total) by mouth at bedtime., Disp: 30 tablet, Rfl: 0   triamcinolone  ointment (KENALOG ) 0.5 %, Apply 1 Application topically 2 (two) times daily. (Patient not taking: Reported on 12/16/2021), Disp: 30 g, Rfl: 0   valACYclovir  (VALTREX ) 1000 MG tablet, 2 tablets now and repeat in 12 hours., Disp: 8 tablet, Rfl: 0   Medications ordered in this encounter:  Meds ordered this encounter  Medications   albuterol  (VENTOLIN  HFA) 108 (90 Base) MCG/ACT inhaler    Sig: Inhale 2 puffs into the lungs daily as needed for wheezing or shortness of breath.    Dispense:  8 g    Refill:  0  Supervising Provider:   Corine Dice [[id number]]     *If you need refills on other medications prior to your next appointment, please contact your pharmacy*  Follow-Up: Call back or seek an in-person evaluation if the symptoms worsen or if the condition fails to improve as anticipated.  [facility] [phone]  Other Instructions Asthma, Adult  Asthma is a long-term (chronic) condition that causes recurrent episodes in which the lower airways in the lungs  become tight and narrow. The narrowing is caused by inflammation and tightening of the smooth muscle around the lower airways. Asthma episodes, also called asthma attacks or asthma flares, may cause coughing, making high-pitched whistling sounds when you breathe, most often when you breathe out (wheezing), shortness of breath, and chest pain. The airways may produce extra mucus caused by the inflammation and irritation. During an attack, it can be difficult to breathe. Asthma attacks can range from minor to life-threatening. Asthma cannot be cured, but medicines and lifestyle changes can help control it and treat acute attacks. It is important to keep your asthma well controlled so the condition does not interfere with your daily life. What are the causes? This condition is believed to be caused by inherited (genetic) and environmental factors, but its exact cause is not known. What can trigger an asthma attack? Many things can bring on an asthma attack or make symptoms worse. These triggers are different for every person. Common triggers include: Allergens and irritants like mold, dust, pet dander, cockroaches, pollen, air pollution, and chemical odors. Cigarette smoke. Weather changes and cold air. Stress and strong emotional responses such as crying or laughing hard. Certain medications such as aspirin or beta blockers. Infections and inflammatory conditions, such as the flu, a cold, pneumonia, or inflammation of the nasal membranes (rhinitis). Gastroesophageal reflux disease (GERD). What are the signs or symptoms? Symptoms may occur right after exposure to an asthma trigger or hours later and can vary by person. Common signs and symptoms include: Wheezing. Trouble breathing (shortness of breath). Excessive nighttime or early morning coughing. Chest tightness. Tiredness (fatigue) with minimal activity. Difficulty talking in complete sentences. Poor exercise tolerance. How is this  diagnosed? This condition is diagnosed based on: A physical exam and your medical history. Tests, which may include: Lung function studies to evaluate the flow of air in your lungs. Allergy tests. Imaging tests, such as X-rays. How is this treated? There is no cure, but symptoms can be controlled with proper treatment. Treatment usually involves: Identifying and avoiding your asthma triggers. Inhaled medicines. Two types are commonly used to treat asthma, depending on severity: Controller medicines. These help prevent asthma symptoms from occurring. They are taken every day. Fast-acting reliever or rescue medicines. These quickly relieve asthma symptoms. They are used as needed and provide short-term relief. Using other medicines, such as: Allergy medicines, such as antihistamines, if your asthma attacks are triggered by allergens. Immune medicines (immunomodulators). These are medicines that help control the immune system. Using supplemental oxygen. This is only needed during a severe episode. Creating an asthma action plan. An asthma action plan is a written plan for managing and treating your asthma attacks. This plan includes: A list of your asthma triggers and how to avoid them. Information about when medicines should be taken and when their dosage should be changed. Instructions about using a device called a peak flow meter. A peak flow meter measures how well the lungs are working and the severity of your asthma. It helps  you monitor your condition. Follow these instructions at home: Take over-the-counter and prescription medicines only as told by your health care provider. Stay up to date on all vaccinations as recommended by your healthcare provider, including vaccines for the flu and pneumonia. Use a peak flow meter and keep track of your peak flow readings. Understand and use your asthma action plan to address any asthma flares. Do not smoke or allow anyone to smoke in your  home. Contact a health care provider if: You have wheezing, shortness of breath, or a cough that is not responding to medicines. Your medicines are causing side effects, such as a rash, itching, swelling, or trouble breathing. You need to use a reliever medicine more than 2-3 times a week. Your peak flow reading is still at 50-79% of your personal best after following your action plan for 1 hour. You have a fever and shortness of breath. Get help right away if: You are getting worse and do not respond to treatment during an asthma attack. You are short of breath when at rest or when doing very little physical activity. You have difficulty eating, drinking, or talking. You have chest pain or tightness. You develop a fast heartbeat or palpitations. You have a bluish color to your lips or fingernails. You are light-headed or dizzy, or you faint. Your peak flow reading is less than 50% of your personal best. You feel too tired to breathe normally. These symptoms may be an emergency. Get help right away. Call 911. Do not wait to see if the symptoms will go away. Do not drive yourself to the hospital. Summary Asthma is a long-term (chronic) condition that causes recurrent episodes in which the airways become tight and narrow. Asthma episodes, also called asthma attacks or asthma flares, can cause coughing, wheezing, shortness of breath, and chest pain. Asthma cannot be cured, but medicines and lifestyle changes can help keep it well controlled and prevent asthma flares. Make sure you understand how to avoid triggers and how and when to use your medicines. Asthma attacks can range from minor to life-threatening. Get help right away if you have an asthma attack and do not respond to treatment with your usual rescue medicines. This information is not intended to replace advice given to you by your health care provider. Make sure you discuss any questions you have with your health care  provider. Document Revised: 12/30/2020 Document Reviewed: 12/21/2020 Elsevier Patient Education  2024 Elsevier Inc.   If you have been instructed to have an in-person evaluation today at a local Urgent Care facility, please use the link below. It will take you to a list of all of our available [facility], including address, phone number and hours of operation. Please do not delay care.  [facility]  If you or a family member do not have a primary care provider, use the link below to schedule a visit and establish care. When you choose a [facility] primary care physician or advanced practice provider, you gain a long-term partner in health. Find a Primary Care Provider  Learn more about [facility]'s in-office and virtual care options: [facility] - Get Care Now

## 2023-09-13 NOTE — Progress Notes (Signed)
 Virtual Visit Consent   Gordon Turner, you are scheduled for a virtual visit with a [facility] provider today. Just as with appointments in the office, your consent must be obtained to participate. Your consent will be active for this visit and any virtual visit you may have with one of our providers in the next 365 days. If you have a MyChart account, a copy of this consent can be sent to you electronically.  As this is a virtual visit, video technology does not allow for your provider to perform a traditional examination. This may limit your provider's ability to fully assess your condition. If your provider identifies any concerns that need to be evaluated in person or the need to arrange testing (such as labs, EKG, etc.), we will make arrangements to do so. Although advances in technology are sophisticated, we cannot ensure that it will always work on either your end or our end. If the connection with a video visit is poor, the visit may have to be switched to a telephone visit. With either a video or telephone visit, we are not always able to ensure that we have a secure connection.  By engaging in this virtual visit, you consent to the provision of healthcare and authorize for your insurance to be billed (if applicable) for the services provided during this visit. Depending on your insurance coverage, you may receive a charge related to this service.  I need to obtain your verbal consent now. Are you willing to proceed with your visit today? Gordon Turner has provided verbal consent on 09/13/2023 for a virtual visit (video or telephone). Angelia Kelp, PA-C  Date: 09/13/2023 6:01 PM   Virtual Visit via Video Note   I, Angelia Kelp, connected with  Gordon Turner  ([id number], 03/29/1985) on 09/13/23 at  6:00 PM EDT by a video-enabled telemedicine application and verified that I am speaking with the correct person using two identifiers.  Location: Patient: Virtual Visit Location  Patient: Mobile Provider: Virtual Visit Location Provider: Home Office   I discussed the limitations of evaluation and management by telemedicine and the availability of in person appointments. The patient expressed understanding and agreed to proceed.    History of Present Illness: Gordon Turner is a 38 y.o. who identifies as a male who was assigned male at birth, and is being seen today for albuterol  inhaler refill. Having no issues acutely, just noticed his inhaler was running low yesterday. Uses intermittently and more so seasonally. Hot weather triggers.   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   Healthcare maintenance 09/22/2021   SBO (small bowel obstruction) (HCC) 03/31/2018   Celiac disease 10/20/2016   Moderate persistent asthma with exacerbation 05/12/2016   Depressive disorder 12/26/2013   Allergic rhinitis due to pollen 09/25/2013   Insomnia 09/25/2013   Panic disorder without agoraphobia 09/25/2013   Chronic sinusitis 04/04/2013   Human immunodeficiency virus (HIV) disease (HCC) 09/27/2011    Allergies:  Allergies  Allergen Reactions   Amoxicillin Anaphylaxis   Penicillins Anaphylaxis, Hives, Swelling and Other (See Comments)    DID THE REACTION INVOLVE: Swelling of the face/tongue/throat, SOB, or low BP? No Sudden or severe rash/hives, skin peeling, or the inside of the mouth or nose? No Did it require medical treatment? No When did it last happen?     baby  If all above answers are "NO", may proceed with cephalosporin use.   Medications:  Current Outpatient Medications:    albuterol  (VENTOLIN  HFA) 108 (90 Base) MCG/ACT inhaler, Inhale 2 puffs into the lungs daily as needed for wheezing or shortness of breath., Disp: 8 g, Rfl: 0   bictegravir-emtricitabine -tenofovir  AF (BIKTARVY ) 50-200-25 MG TABS tablet, Take 1 tablet by mouth  daily., Disp: 30 tablet, Rfl: 5   Brimonidine  Tartrate (LUMIFY  OP), Place 1 drop into both eyes daily., Disp: , Rfl:    diphenhydrAMINE  (BENADRYL ) 25 MG tablet, 1 to 2 tablets every 6 hours for itching and swelling (Patient taking differently: Take 50 mg by mouth at bedtime as needed for sleep.), Disp: 20 tablet, Rfl: 0   hydrocortisone  (ANUSOL -HC) 25 MG suppository, Place 1 suppository (25 mg total) rectally 2 (two) times daily., Disp: 12 suppository, Rfl: 1   hydrOXYzine  (VISTARIL ) 25 MG capsule, Take 1 capsule (25 mg total) by mouth every 8 (eight) hours as needed., Disp: 30 capsule, Rfl: 0   ipratropium (ATROVENT ) 0.02 % nebulizer solution, Take 0.5 mg by nebulization as needed for shortness of breath or wheezing., Disp: , Rfl:    ipratropium (ATROVENT ) 0.03 % nasal spray, Place 2 sprays into both nostrils every 12 (twelve) hours., Disp: 30 mL, Rfl: 12   Ipratropium-Albuterol  (COMBIVENT  RESPIMAT) 20-100 MCG/ACT AERS respimat, Inhale 1 puff into the lungs every 6 (six) hours. (Patient taking differently: Inhale 1 puff into the lungs as needed for shortness of breath.), Disp: 4 g, Rfl: 0   ondansetron  (ZOFRAN -ODT) 4 MG disintegrating tablet, Take 1 tablet (4 mg total) by mouth every 8 (eight) hours as needed for nausea or vomiting., Disp: 30 tablet, Rfl: 1   penciclovir  (DENAVIR ) 1 % cream, Apply 1 Application topically every 2 (two) hours. (Patient not taking: Reported on 05/16/2022), Disp: 5 g, Rfl: 0   predniSONE  (DELTASONE ) 20 MG tablet, Take 2 tablets (40 mg total) by mouth daily. (Patient not taking: Reported on 05/16/2022), Disp: 10 tablet, Rfl: 0   traMADol  (ULTRAM ) 50 MG tablet, Take 1 tablet (50 mg total) by mouth every 6 (six) hours as needed for moderate pain. (Patient not taking: Reported on 05/16/2022), Disp: 15 tablet, Rfl: 0   traZODone  (DESYREL ) 50 MG tablet, Take 1-2 tablets (50-100 mg total) by mouth at bedtime., Disp: 30 tablet, Rfl: 0   triamcinolone  ointment (KENALOG ) 0.5 %, Apply 1  Application topically 2 (two) times daily. (Patient not taking: Reported on 12/16/2021), Disp: 30 g, Rfl: 0   valACYclovir  (VALTREX ) 1000 MG tablet, 2 tablets now and repeat in 12 hours., Disp: 8 tablet, Rfl: 0  Observations/Objective: Patient is well-developed, well-nourished in no acute distress.  Resting comfortably  Head is normocephalic, atraumatic.  No labored breathing.  Speech is clear and coherent with logical content.  Patient is alert and oriented at baseline.    Assessment and Plan: 1. Mild intermittent asthma without complication (Primary) - albuterol  (VENTOLIN  HFA) 108 (90 Base) MCG/ACT inhaler; Inhale 2 puffs into the lungs daily as needed for wheezing or shortness of breath.  Dispense: 8 g; Refill: 0  - Albuterol  refilled for PRN use - Seek further evaluation if not improving or symptoms worsen  Follow Up Instructions: I discussed the assessment and treatment plan with the patient. The patient was provided an opportunity to ask questions and all were answered. The patient agreed with the plan and demonstrated an understanding of the instructions.  A copy of instructions were sent to the patient via MyChart unless  otherwise noted below.    The patient was advised to call back or seek an in-person evaluation if the symptoms worsen or if the condition fails to improve as anticipated.    Angelia Kelp, PA-C

## 2023-12-05 ENCOUNTER — Telehealth: Payer: Self-pay

## 2023-12-05 NOTE — Telephone Encounter (Signed)
 Attempt to Re-Engage in Care  No office visit or HIV labs completed at [facility] within the last 12 months. Patient is considered out of care.   Last [facility] Visit: 06/17/22  Last HIV Viral Load:  HIV 1 RNA Quant  Date Value Ref Range Status  05/18/2022 Not Detected Copies/mL Final    Last CD4 Count:  CD4 T Cell Abs  Date Value Ref Range Status  05/18/2022 237 (L) 400 - 1,790 /uL Final    Medication Dispense History:   Dispensed Days Supply Quantity Provider Pharmacy  bictegravir-emtricitabine -tenofovir  AF (BIKTARVY ) 50-200-25 MG TABS tablet 10/13/2022 30 30 tablet Calone, Gregory D, FNP [facility]  bictegravir-emtricitabine -tenofovir  AF (BIKTARVY ) 50-200-25 MG TABS tablet 05/23/2022 30 30 tablet Calone, Gregory D, FNP [facility]...    Interventions: Florence Lenis, no answer and voicemail full. MyChart message sent.   No recent Care Everywhere updates. No labs in [facility].   Duration of Services: 10 minutes  Sinai Mahany, BSN, Charity fundraiser

## 2024-04-12 ENCOUNTER — Telehealth: Payer: Self-pay

## 2024-04-12 NOTE — Telephone Encounter (Signed)
 Called patient to schedule appointment, no answer. Left HIPAA compliant voicemail requesting callback.   Hamilton Marinello, BSN, RN
